# Patient Record
Sex: Male | Born: 1945 | Race: White | Hispanic: No | Smoking: Former smoker
Health system: Southern US, Community
[De-identification: ages and names within clinical notes are randomized; demographics above are authoritative.]

## PROBLEM LIST (undated history)

## (undated) DIAGNOSIS — E079 Disorder of thyroid, unspecified: Secondary | ICD-10-CM

## (undated) DIAGNOSIS — I10 Essential (primary) hypertension: Secondary | ICD-10-CM

## (undated) DIAGNOSIS — E785 Hyperlipidemia, unspecified: Secondary | ICD-10-CM

## (undated) DIAGNOSIS — E119 Type 2 diabetes mellitus without complications: Secondary | ICD-10-CM

## (undated) DIAGNOSIS — N200 Calculus of kidney: Secondary | ICD-10-CM

## (undated) HISTORY — PX: APPENDECTOMY: SHX54

## (undated) HISTORY — PX: HEMORRHOID SURGERY: SHX153

## (undated) HISTORY — DX: Hyperlipidemia, unspecified: E78.5

## (undated) HISTORY — DX: Disorder of thyroid, unspecified: E07.9

---

## 2004-09-18 ENCOUNTER — Encounter: Admission: RE | Admit: 2004-09-18 | Discharge: 2004-09-18 | Payer: Self-pay | Admitting: Family Medicine

## 2005-06-27 ENCOUNTER — Emergency Department (HOSPITAL_COMMUNITY): Admission: EM | Admit: 2005-06-27 | Discharge: 2005-06-27 | Payer: Self-pay | Admitting: Emergency Medicine

## 2005-11-05 ENCOUNTER — Ambulatory Visit (HOSPITAL_COMMUNITY): Admission: RE | Admit: 2005-11-05 | Discharge: 2005-11-05 | Payer: Self-pay | Admitting: Gastroenterology

## 2006-08-18 ENCOUNTER — Ambulatory Visit (HOSPITAL_COMMUNITY): Admission: RE | Admit: 2006-08-18 | Discharge: 2006-08-18 | Payer: Self-pay | Admitting: Gastroenterology

## 2006-08-18 ENCOUNTER — Encounter (INDEPENDENT_AMBULATORY_CARE_PROVIDER_SITE_OTHER): Payer: Self-pay | Admitting: Specialist

## 2006-09-16 ENCOUNTER — Ambulatory Visit (HOSPITAL_COMMUNITY): Admission: RE | Admit: 2006-09-16 | Discharge: 2006-09-16 | Payer: Self-pay | Admitting: Gastroenterology

## 2006-10-14 ENCOUNTER — Encounter: Admission: RE | Admit: 2006-10-14 | Discharge: 2006-10-14 | Payer: Self-pay | Admitting: General Surgery

## 2006-10-22 ENCOUNTER — Ambulatory Visit (HOSPITAL_COMMUNITY): Admission: RE | Admit: 2006-10-22 | Discharge: 2006-10-22 | Payer: Self-pay | Admitting: General Surgery

## 2006-10-26 ENCOUNTER — Ambulatory Visit (HOSPITAL_COMMUNITY): Admission: RE | Admit: 2006-10-26 | Discharge: 2006-10-26 | Payer: Self-pay | Admitting: General Surgery

## 2006-10-29 ENCOUNTER — Ambulatory Visit (HOSPITAL_COMMUNITY): Admission: RE | Admit: 2006-10-29 | Discharge: 2006-10-30 | Payer: Self-pay | Admitting: General Surgery

## 2006-10-29 ENCOUNTER — Encounter (INDEPENDENT_AMBULATORY_CARE_PROVIDER_SITE_OTHER): Payer: Self-pay | Admitting: Specialist

## 2007-08-31 ENCOUNTER — Ambulatory Visit (HOSPITAL_COMMUNITY): Admission: RE | Admit: 2007-08-31 | Discharge: 2007-08-31 | Payer: Self-pay | Admitting: Family Medicine

## 2010-09-22 ENCOUNTER — Encounter: Payer: Self-pay | Admitting: Gastroenterology

## 2011-01-17 NOTE — Op Note (Signed)
[name], [name]              ACCOUNT NO.:  [id number]   MEDICAL RECORD NO.:  [id number]          PATIENT TYPE:  AMB   LOCATION:  ENDO                         FACILITY:  [facility]   PHYSICIAN:  Petra Kuba, M.D.    DATE OF BIRTH:  [birth year]   DATE OF PROCEDURE:  08/19/2006  DATE OF DISCHARGE:                               OPERATIVE REPORT   PROCEDURE:  Colonoscopy.   INDICATIONS:  Change in bowel habits, due for colonic screening.  Consent was signed after risks, benefits, methods, options thoroughly  discussed in the office.   MEDICINES USED:  Fentanyl 100 mcg, Versed 8 mg.   PROCEDURE:  Rectal inspection is pertinent for external hemorrhoids,  significant.  Digital exam was tender.  No mass.  The video colonoscope  was inserted and with some difficulty due to a tortuous long, looping  colon with rolling him on his back and abdominal pressure, were able to  be advanced to the cecum.  No abnormalities were seen on insertion.  The  cecum was identified by the appendiceal orifice and ileocecal valve.  The scope was slowly withdrawn.  In the mid ascending were two small  pedunculated polyps which were both snared, electrocautery applied,  polyps were removed, suctioned through the scope and collected in a  trap.  The scope was slowly withdrawn.  He had a particularly tortuous  hepatic flexure.  As scope was slowly withdrawn in the mid and distal  transverse, two other small polyps were seen and both were snared,  electrocautery applied, suctioned through the scope, collected in the  trap, and put in a second container.  The scope was further withdrawn.  At approximately the sigmoid-descending junction was also another area  of significant tortuosity, a little bit scope trauma in this area but no  other abnormalities.  Scope was further withdrawn back to the rectum.  No additional findings were seen.  Once back in the rectum, anorectal  pull-through and retroflexion confirmed  significant hemorrhoids.  The  scope was straightened and readvanced a short way up the left side of  the colo, air was suctioned, scope removed.  The patient tolerated the  procedure fairly adequately.  There was no obvious immediate  complication.   ENDOSCOPIC DIAGNOSES:  1. Significant internal-external hemorrhoids.  2. Tortuous proximal sigmoid and hepatic flexure.  3. Four small polyps, two in the transverse, two in the ascending, all      snared and removed.  4. Otherwise within normal limits to the cecum.   PLAN:  Await pathology but probably recheck colon screening in 3 years.  Might even consider Diprivan.  Probably will need surgical consultation  for his hemorrhoids.  Happy to see back p.r.n., particularly if altered  bowel habits continue.          ______________________________  Petra Kuba, M.D.    MEM/MEDQ  D:  08/18/2006  T:  08/19/2006  Job:  [id number]   cc:   Olena Leatherwood [facility]

## 2011-01-17 NOTE — Op Note (Signed)
[name], [name]              ACCOUNT NO.:  [id number]   MEDICAL RECORD NO.:  [id number]          PATIENT TYPE:  AMB   LOCATION:  DAY                          FACILITY:  [name]   PHYSICIAN:  [name], M.D. DATE OF BIRTH:  1946/07/23   DATE OF PROCEDURE:  10/26/2006  DATE OF DISCHARGE:                               OPERATIVE REPORT   PREOPERATIVE DIAGNOSIS:  Hemorrhoids with prolapse, bleeding and anemia.   POSTOPERATIVE DIAGNOSIS:  Hemorrhoids with prolapse, bleeding and  anemia, plus massive prolapse.   OPERATION PERFORMED:  Examination under anesthesia, injection of  hemorrhoids to attempt reduction in size.   SURGEON:  [name], M.D.   ANESTHESIA:  General.   INDICATIONS FOR PROCEDURE:  [name] has been seen and worked up in  the office following a GI work-up by Dr. Vida Cochran.  His primary care  is [name].  Dr. Ewing Cochran had done endoscopy finding  no other source of rectal bleeding, except hemorrhoids.  The patient  when seen in the office was advised that hemorrhoidectomy would be in  order.  He has had hemorrhoids for 40 plus years, had always had some  bleeding, has been more in the recent past and he has used every  conservative management method possible.  He is otherwise well, a bit  overweight, but takes good care of himself.  He is self employed.  Because of abdominal symptoms, in addition to the colonoscopy, I did a  CT scan of the abdomen, which was negative.  Proctoscopy in the office  was negative, showing rather large hemorrhoids.  He is known to have  large external components as well as huge internal components with  prolapse so the choice of procedure we had left until today.   The patient was seen, interviewed, identified and the permit signed.   DESCRIPTION OF PROCEDURE:  The patient was taken to the operating room  and placed supine.  LMA anesthesia provided.  He was placed in lithotomy  and surprisingly, his  hemorrhoids were larger than ever with rather  massive prolapse edema of the external components.  So the area was  prepped and draped in the usual fashion.  I used 30 mL of Marcaine with  1 mL of Wydase and injected that round and about the anal orifice and  into the body of the hemorrhoids and massaged and massaged.  I put the  patient's head down.  I reduced the hemorrhoids manually and massaged  and massaged and I could not get the hemorrhoids to reduce in size  enough to hardly allow for the position of an operating anoscope and  then once the operating anoscope was in place, each of the three  hemorrhoids filled the entire lumen of the anoscope instrument. I think  it would be very unsafe today to try to do either a closed surgical  hemorrhoidectomy or PPH under the circumstances of the size and the  amount of swelling.  Therefore, I elected to do nothing today.  The  hemorrhoids were bandaged and a Foley catheter was inserted.   The  plan will be to sent the patient home, keep him at bed rest, keep  him on clear liquids only and revisit him day to day and decide when  would be an optimum time for hemorrhoidal procedure, either surgical  closed hemorrhoidectomy, PPH hemorrhoidectomy or perhaps, both.  So, the  patient will be discharged today in the care of his wife, Ms. [name], in [place] to be followed closely.      [name], M.D.  Electronically Signed     TED/MEDQ  D:  10/26/2006  T:  10/26/2006  Job:  [id number]   cc:   [name], M.D.  Fax: [phone]

## 2011-01-17 NOTE — Op Note (Signed)
[name], [name]              ACCOUNT NO.:  [id number]   MEDICAL RECORD NO.:  [id number]          PATIENT TYPE:  OIB   LOCATION:  0098                         FACILITY:  [facility]   PHYSICIAN:  Timothy E. Earlene Plater, M.D. DATE OF BIRTH:  [birth year]   DATE OF PROCEDURE:  10/29/2006  DATE OF DISCHARGE:                               OPERATIVE REPORT   PREOPERATIVE DIAGNOSIS:  Internal and external hemorrhoids with prolapse  and bleeding.   POSTOPERATIVE DIAGNOSIS:  Internal and external hemorrhoids with  prolapse and bleeding.   PROCEDURE:  Hemorrhoidectomy.   SURGEON:  Timothy E. Earlene Plater, M.D.   ANESTHESIA:  General.   [name] is 40, otherwise healthy, has had hemorrhoids for years, and  only recently have they produced prolapse and significant bleeding. When  seen and evaluated, he was moderately anemic. Surgery was attempted  earlier this week but because of massive size and edema, it was  cancelled.  In the meantime, he has been at bed rest with urinary  drainage and transfusions have been given.  He is now prepared for  surgery.  I have spent greater than 90 minutes in consultation with him  and his wife regarding this complicated situation, the specifics of the  surgery, and the expectations and complications.  He is seen, identified  and the permit signed.   He was taken to the operating room and placed supine and general LMA  anesthesia provided.  He was then lifted into the lithotomy position.  The perianal area was inspected, prepped and draped in the usual  fashion.  Hemorrhoids were remarkable in size.  There was no active  bleeding but the edema had been pretty much reduced.  There were large  internal hemorrhoids with prolapse left lateral, there were large  external components.  The area was injected around and about with 0.25%  Marcaine, epinephrine, and Wydase, and massaged in well.  Using the  large operating anoscope, the left lateral hemorrhoid was  approached  first.  A suture ligature of 2-0 chromic was placed at its apex and then  very careful sharp dissection lifted the hemorrhoid from its base. The  sphincter was avoided.  The apex of the hemorrhoid was clamped and the  hemorrhoid removed and then the same 2-0 chromic suture used in a  running fashion to the anoderm.  Then, careful but fairly extensive  undermining of the edges was accomplished removing large amounts of  superficial varicosities.  The wound was then closed completely with a  running 2-0 chromic.  The right posterior hemorrhoid was treated in the  same fashion.  I elected not to remove a residual right anterior  internal hemorrhoid at this time, it was indolent and I felt that there  had been adequate excision of tissue, particularly across the anoderm  and to avoid stricturing and other complications, the procedure was  stopped at this point.  The wound was checked extensively over 20  minutes for any bleeding.  There was none.  So the rectum was packed  with Surgicel wrapped over Gelfoam and a dry external dressing applied.  All  counts were correct.  He tolerated it well.  Blood loss 50 mL or  less.  He remained stable was removed to the recovery room in good  condition.   The patient will be kept at least overnight because of the risk of  bleeding and the magnitude of the surgery and his known anemia.  Following that, he will be discharged appropriately and followed as an  outpatient.      Timothy E. Earlene Plater, M.D.  Electronically Signed     TED/MEDQ  D:  10/29/2006  T:  10/29/2006  Job:  [id number]   cc:   Petra Kuba, M.D.  Fax: [phone]

## 2012-11-17 ENCOUNTER — Telehealth: Payer: Self-pay | Admitting: Family Medicine

## 2012-11-17 MED ORDER — AMLODIPINE BESYLATE 5 MG PO TABS: 5.0000 mg | ORAL_TABLET | Freq: Every day | ORAL | Status: DC

## 2012-11-17 NOTE — Telephone Encounter (Signed)
done

## 2012-11-22 ENCOUNTER — Telehealth: Payer: Self-pay | Admitting: Family Medicine

## 2012-11-22 MED ORDER — ATORVASTATIN CALCIUM 40 MG PO TABS: 40.0000 mg | ORAL_TABLET | Freq: Every day | ORAL | Status: DC

## 2012-11-22 NOTE — Telephone Encounter (Signed)
rx refilled.

## 2012-12-13 ENCOUNTER — Telehealth: Payer: Self-pay | Admitting: Family Medicine

## 2012-12-13 MED ORDER — METFORMIN HCL 500 MG PO TABS: 500.0000 mg | ORAL_TABLET | Freq: Two times a day (BID) | ORAL | Status: DC

## 2012-12-13 NOTE — Telephone Encounter (Signed)
Rx Refilled  

## 2012-12-24 ENCOUNTER — Other Ambulatory Visit: Payer: Self-pay | Admitting: Family Medicine

## 2012-12-26 ENCOUNTER — Other Ambulatory Visit: Payer: Self-pay | Admitting: Family Medicine

## 2012-12-29 ENCOUNTER — Encounter (HOSPITAL_COMMUNITY): Payer: Self-pay | Admitting: Emergency Medicine

## 2012-12-29 ENCOUNTER — Emergency Department (HOSPITAL_COMMUNITY)
Admission: EM | Admit: 2012-12-29 | Discharge: 2012-12-29 | Disposition: A | Discharge status: Home / Self Care | Payer: Medicare PPO | Attending: Emergency Medicine | Admitting: Emergency Medicine

## 2012-12-29 ENCOUNTER — Emergency Department (HOSPITAL_COMMUNITY): Payer: Medicare PPO

## 2012-12-29 DIAGNOSIS — Z87891 Personal history of nicotine dependence: Secondary | ICD-10-CM | POA: Insufficient documentation

## 2012-12-29 DIAGNOSIS — N200 Calculus of kidney: Secondary | ICD-10-CM | POA: Insufficient documentation

## 2012-12-29 DIAGNOSIS — Z9089 Acquired absence of other organs: Secondary | ICD-10-CM | POA: Insufficient documentation

## 2012-12-29 DIAGNOSIS — E119 Type 2 diabetes mellitus without complications: Secondary | ICD-10-CM | POA: Insufficient documentation

## 2012-12-29 DIAGNOSIS — Z79899 Other long term (current) drug therapy: Secondary | ICD-10-CM | POA: Insufficient documentation

## 2012-12-29 DIAGNOSIS — Z7982 Long term (current) use of aspirin: Secondary | ICD-10-CM | POA: Insufficient documentation

## 2012-12-29 DIAGNOSIS — I10 Essential (primary) hypertension: Secondary | ICD-10-CM | POA: Insufficient documentation

## 2012-12-29 HISTORY — DX: Type 2 diabetes mellitus without complications: E11.9

## 2012-12-29 HISTORY — DX: Essential (primary) hypertension: I10

## 2012-12-29 HISTORY — DX: Calculus of kidney: N20.0

## 2012-12-29 LAB — CBC WITH DIFFERENTIAL/PLATELET
Basophils Absolute: 0 10*3/uL (ref 0.0–0.1)
Basophils Relative: 0 % (ref 0–1)
Eosinophils Absolute: 0.2 10*3/uL (ref 0.0–0.7)
Eosinophils Relative: 2 % (ref 0–5)
HCT: 39.4 % (ref 39.0–52.0)
Hemoglobin: 14 g/dL (ref 13.0–17.0)
Lymphocytes Relative: 13 % (ref 12–46)
Lymphs Abs: 1.1 10*3/uL (ref 0.7–4.0)
MCH: 29.8 pg (ref 26.0–34.0)
MCHC: 35.5 g/dL (ref 30.0–36.0)
MCV: 83.8 fL (ref 78.0–100.0)
Monocytes Absolute: 0.5 10*3/uL (ref 0.1–1.0)
Monocytes Relative: 6 % (ref 3–12)
Neutro Abs: 6.2 10*3/uL (ref 1.7–7.7)
Neutrophils Relative %: 78 % — ABNORMAL HIGH (ref 43–77)
Platelets: 133 10*3/uL — ABNORMAL LOW (ref 150–400)
RBC: 4.7 MIL/uL (ref 4.22–5.81)
RDW: 12.6 % (ref 11.5–15.5)
WBC: 8 10*3/uL (ref 4.0–10.5)

## 2012-12-29 LAB — BASIC METABOLIC PANEL
BUN: 15 mg/dL (ref 6–23)
CO2: 26 mEq/L (ref 19–32)
Calcium: 9.7 mg/dL (ref 8.4–10.5)
Chloride: 103 mEq/L (ref 96–112)
Creatinine, Ser: 1.18 mg/dL (ref 0.50–1.35)
GFR calc Af Amer: 71 mL/min — ABNORMAL LOW (ref >90)
GFR calc non Af Amer: 62 mL/min — ABNORMAL LOW (ref >90)
Glucose, Bld: 196 mg/dL — ABNORMAL HIGH (ref 70–99)
Potassium: 4.4 mEq/L (ref 3.5–5.1)
Sodium: 139 mEq/L (ref 135–145)

## 2012-12-29 MED ORDER — KETOROLAC TROMETHAMINE 30 MG/ML IJ SOLN
30.0000 mg | Freq: Once | INTRAMUSCULAR | Status: AC
  Administered 2012-12-29: 30 mg via INTRAVENOUS
  Filled 2012-12-29: qty 1

## 2012-12-29 MED ORDER — TAMSULOSIN HCL 0.4 MG PO CAPS: 0.4000 mg | ORAL_CAPSULE | Freq: Every day | ORAL | Status: DC

## 2012-12-29 MED ORDER — PROMETHAZINE HCL 25 MG PO TABS: 25.0000 mg | ORAL_TABLET | Freq: Four times a day (QID) | ORAL | Status: DC | PRN

## 2012-12-29 MED ORDER — OXYCODONE-ACETAMINOPHEN 5-325 MG PO TABS: 1.0000 | ORAL_TABLET | Freq: Four times a day (QID) | ORAL | Status: DC | PRN

## 2012-12-29 MED ORDER — ONDANSETRON HCL 4 MG/2ML IJ SOLN
4.0000 mg | Freq: Once | INTRAMUSCULAR | Status: AC
  Administered 2012-12-29: 4 mg via INTRAVENOUS
  Filled 2012-12-29: qty 2

## 2012-12-29 MED ORDER — HYDROMORPHONE HCL PF 1 MG/ML IJ SOLN
1.0000 mg | Freq: Once | INTRAMUSCULAR | Status: AC
  Administered 2012-12-29: 1 mg via INTRAVENOUS
  Filled 2012-12-29: qty 1

## 2012-12-29 NOTE — ED Notes (Signed)
Pt with c/o left flank pain. Sudden onset at 0700 contracting stabbing sharp pain. Pt reports hx of kidney stones.

## 2012-12-29 NOTE — ED Notes (Signed)
Pt provided strainer, water at time of discharge.

## 2012-12-29 NOTE — ED Notes (Signed)
Pt returned from CT. Reporting return of pain. EDP made aware , awaiting CT results. Vitals stable, A x 4

## 2012-12-29 NOTE — ED Provider Notes (Signed)
History     CSN: [id number]  Arrival date & time 12/29/12  7829   First MD Initiated Contact with Patient 12/29/12 [phone]      Chief Complaint  Patient presents with  . Flank Pain    (Consider location/radiation/quality/duration/timing/severity/associated sxs/prior treatment) Patient is a 67 y.o. male presenting with flank pain. The history is provided by the patient (the pt complains of left flank pain).  Flank Pain This is a new problem. The current episode started 3 to 5 hours ago. The problem occurs constantly. The problem has not changed since onset.Pertinent negatives include no chest pain, no abdominal pain and no headaches. Nothing aggravates the symptoms. Nothing relieves the symptoms.    Past Medical History  Diagnosis Date  . Kidney stones   . Hypertension   . Diabetes mellitus without complication     Past Surgical History  Procedure Laterality Date  . Hemorrhoid surgery    . Appendectomy      No family history on file.  History  Substance Use Topics  . Smoking status: Former Games developer  . Smokeless tobacco: Not on file  . Alcohol Use: No      Review of Systems  Constitutional: Negative for appetite change and fatigue.  HENT: Negative for congestion, sinus pressure and ear discharge.   Eyes: Negative for discharge.  Respiratory: Negative for cough.   Cardiovascular: Negative for chest pain.  Gastrointestinal: Negative for abdominal pain and diarrhea.  Genitourinary: Positive for flank pain. Negative for frequency and hematuria.  Musculoskeletal: Negative for back pain.  Skin: Negative for rash.  Neurological: Negative for seizures and headaches.  Psychiatric/Behavioral: Negative for hallucinations.    Allergies  Review of patient's allergies indicates no known allergies.  Home Medications   Current Outpatient Rx  Name  Route  Sig  Dispense  Refill  . amLODipine (NORVASC) 5 MG tablet   Oral   Take 1 tablet (5 mg total) by mouth daily.   30  tablet   5   . aspirin EC 81 MG tablet   Oral   Take 81 mg by mouth daily.         Marland Kitchen atorvastatin (LIPITOR) 40 MG tablet      TAKE 1 TABLET BY MOUTH AT BEDTIME   30 tablet   5   . levothyroxine (SYNTHROID, LEVOTHROID) 125 MCG tablet      TAKE 1 TABLET BY MOUTH ONCE DAILY   30 tablet   3   . metFORMIN (GLUCOPHAGE) 500 MG tablet   Oral   Take 1 tablet (500 mg total) by mouth 2 (two) times daily with a meal.   60 tablet   5   . oxyCODONE-acetaminophen (PERCOCET/ROXICET) 5-325 MG per tablet   Oral   Take 1 tablet by mouth every 6 (six) hours as needed for pain.   20 tablet   0   . promethazine (PHENERGAN) 25 MG tablet   Oral   Take 1 tablet (25 mg total) by mouth every 6 (six) hours as needed for nausea.   15 tablet   0   . tamsulosin (FLOMAX) 0.4 MG CAPS   Oral   Take 1 capsule (0.4 mg total) by mouth daily.   5 capsule   0     BP 144/89  Pulse 65  Temp(Src) 97.7 F (36.5 C) (Oral)  Resp 24  Ht 6' (1.829 m)  Wt 210 lb (95.255 kg)  BMI 28.47 kg/m2  SpO2 95%  Physical Exam  Constitutional: He is  oriented to person, place, and time. He appears well-developed.  HENT:  Head: Normocephalic.  Eyes: Conjunctivae and EOM are normal. No scleral icterus.  Neck: Neck supple. No thyromegaly present.  Cardiovascular: Normal rate and regular rhythm.  Exam reveals no gallop and no friction rub.   No murmur heard. Pulmonary/Chest: No stridor. He has no wheezes. He has no rales. He exhibits no tenderness.  Abdominal: He exhibits no distension. There is no tenderness. There is no rebound.  Genitourinary:  Tender left flank  Musculoskeletal: Normal range of motion. He exhibits no edema.  Lymphadenopathy:    He has no cervical adenopathy.  Neurological: He is oriented to person, place, and time. Coordination normal.  Skin: No rash noted. No erythema.  Psychiatric: He has a normal mood and affect. His behavior is normal.    ED Course  Procedures (including  critical care time)  Labs Reviewed  CBC WITH DIFFERENTIAL - Abnormal; Notable for the following:    Platelets 133 (*)    Neutrophils Relative 78 (*)    All other components within normal limits  BASIC METABOLIC PANEL - Abnormal; Notable for the following:    Glucose, Bld 196 (*)    GFR calc non Af Amer 62 (*)    GFR calc Af Amer 71 (*)    All other components within normal limits   Ct Abdomen Pelvis Wo Contrast  12/29/2012  *RADIOLOGY REPORT*  Clinical Data: Left flank pain.  History of kidney stones.  CT ABDOMEN AND PELVIS WITHOUT CONTRAST  Technique:  Multidetector CT imaging of the abdomen and pelvis was performed following the standard protocol without intravenous contrast.  Comparison: 01/13/2007.  Findings: Left-sided hydronephrosis with distal left ureteral 3.5 mm calculus located 1 cm proximal to the left ureteral vesicle junction.  There is also a 1 mm calculus 2 cm proximal to this region.  Fatty infiltration of the liver. Evaluation of solid abdominal viscera is limited by lack of IV contrast.  Taking this limitation into account no worrisome focal hepatic, pancreatic, splenic, adrenal or renal lesion.  Left renal 1.5 cm cyst.  Calcification of the gallbladder.  Some these represent dependent gallstones.  Calcified gallbladder wall not excluded.  Small hiatal hernia.  No extraluminal bowel inflammatory process, free fluid or free air.  Prominent coronary artery calcifications.  Calcification of the aorta which is mildly ectatic without focal aneurysmal dilation. Calcification iliac vessels.  Degenerative changes lumbar spine.  No bony destructive lesion.  No hernia noted.  IMPRESSION: Left-sided hydronephrosis with distal left ureteral 3.5 mm calculus located 1 cm proximal to the left ureteral vesicle junction.  There is also a 1 mm calculus 2 cm proximal to this region.  Please see above for additional findings.   Original Report Authenticated By: Lacy Duverney, M.D.      1. Kidney stone        MDM          Benny Lennert, MD 12/29/12 [phone]

## 2013-01-04 ENCOUNTER — Ambulatory Visit
Admission: RE | Admit: 2013-01-04 | Discharge: 2013-01-04 | Disposition: A | Discharge status: Home / Self Care | Payer: 59 | Source: Ambulatory Visit | Attending: Family Medicine | Admitting: Family Medicine

## 2013-01-04 ENCOUNTER — Ambulatory Visit (INDEPENDENT_AMBULATORY_CARE_PROVIDER_SITE_OTHER): Payer: 59 | Admitting: Family Medicine

## 2013-01-04 ENCOUNTER — Encounter: Payer: Self-pay | Admitting: Family Medicine

## 2013-01-04 VITALS — BP 160/84 | HR 88 | Temp 98.2°F | Resp 20 | Wt 202.0 lb

## 2013-01-04 DIAGNOSIS — N200 Calculus of kidney: Secondary | ICD-10-CM

## 2013-01-04 DIAGNOSIS — I10 Essential (primary) hypertension: Secondary | ICD-10-CM

## 2013-01-04 DIAGNOSIS — E785 Hyperlipidemia, unspecified: Secondary | ICD-10-CM

## 2013-01-04 DIAGNOSIS — E119 Type 2 diabetes mellitus without complications: Secondary | ICD-10-CM

## 2013-01-04 LAB — HEPATIC FUNCTION PANEL
ALT: 15 U/L (ref 0–53)
AST: 17 U/L (ref 0–37)
Alkaline Phosphatase: 103 U/L (ref 39–117)
Bilirubin, Direct: 0.4 mg/dL — ABNORMAL HIGH (ref 0.0–0.3)
Indirect Bilirubin: 1.1 mg/dL — ABNORMAL HIGH (ref 0.0–0.9)

## 2013-01-04 LAB — URINALYSIS, ROUTINE W REFLEX MICROSCOPIC
Ketones, ur: NEGATIVE mg/dL
Nitrite: NEGATIVE
Protein, ur: 30 mg/dL — AB
Specific Gravity, Urine: 1.025 (ref 1.005–1.030)
Urobilinogen, UA: 4 mg/dL — ABNORMAL HIGH (ref 0.0–1.0)

## 2013-01-04 LAB — CBC WITH DIFFERENTIAL/PLATELET
Basophils Absolute: 0 10*3/uL (ref 0.0–0.1)
Eosinophils Relative: 3 % (ref 0–5)
Lymphocytes Relative: 10 % — ABNORMAL LOW (ref 12–46)
MCV: 85.9 fL (ref 78.0–100.0)
Platelets: 176 10*3/uL (ref 150–400)
RDW: 13.6 % (ref 11.5–15.5)
WBC: 6.9 10*3/uL (ref 4.0–10.5)

## 2013-01-04 LAB — HEMOGLOBIN A1C: Mean Plasma Glucose: 143 mg/dL — ABNORMAL HIGH (ref <117)

## 2013-01-04 LAB — LIPID PANEL
HDL: 32 mg/dL — ABNORMAL LOW (ref >39)
LDL Cholesterol: 82 mg/dL (ref 0–99)
Total CHOL/HDL Ratio: 4.9 Ratio

## 2013-01-04 LAB — BASIC METABOLIC PANEL
Chloride: 97 mEq/L (ref 96–112)
Potassium: 4.6 mEq/L (ref 3.5–5.3)
Sodium: 136 mEq/L (ref 135–145)

## 2013-01-04 LAB — URINALYSIS, MICROSCOPIC ONLY: Crystals: NONE SEEN

## 2013-01-04 MED ORDER — CIPROFLOXACIN HCL 500 MG PO TABS: 500.0000 mg | ORAL_TABLET | Freq: Two times a day (BID) | ORAL | Status: DC

## 2013-01-04 MED ORDER — OXYCODONE-ACETAMINOPHEN 5-325 MG PO TABS: 1.0000 | ORAL_TABLET | Freq: Four times a day (QID) | ORAL | Status: DC | PRN

## 2013-01-04 NOTE — Progress Notes (Signed)
Subjective:    Patient ID: [name], male    DOB: [birth year], 67 y.o.   MRN: [id number]  HPI  patient went to the emergency room one week ago left-sided abdominal pain. He was found to have a 3.5 mm kidney stone in 1 cm proximal to the bladder in the left ureter. He is also having some left hydronephrosis. He's been taking Percocet as prescribed he has also been on Flomax as prescribed. The pain is now settled and constant in his left lower quadrant. He's not had a bowel movement in over a week he is also reporting subjective fevers and chills. He denies nausea or vomiting. Her he has no appetite he has not been eating. His blood pressure is elevated today due to pain.  SS history hyperlipidemia for which he takes Lipitor 40 mg by mouth daily he denies myalgias right upper quadrant pain. He hasn't has diabetes mellitus type 2. He is currently on metformin. He denies polyuria, August or blurred vision. He also has hypertension. He current takes Norvasc 5 mg by mouth daily. He denies chest pain or shortness of breath. I wanted to add an ACE inhibitor in the past, however given the issues with his kidney stone and I do not feel is an appropriate time to even consider that. Past Medical History  Diagnosis Date  . Diabetes mellitus without complication   . Hypertension   . Kidney stones    Current Outpatient Prescriptions on File Prior to Visit  Medication Sig Dispense Refill  . amLODipine (NORVASC) 5 MG tablet Take 1 tablet (5 mg total) by mouth daily.  30 tablet  5  . aspirin EC 81 MG tablet Take 81 mg by mouth daily.      Marland Kitchen atorvastatin (LIPITOR) 40 MG tablet TAKE 1 TABLET BY MOUTH AT BEDTIME  30 tablet  5  . levothyroxine (SYNTHROID, LEVOTHROID) 125 MCG tablet TAKE 1 TABLET BY MOUTH ONCE DAILY  30 tablet  3  . metFORMIN (GLUCOPHAGE) 500 MG tablet Take 1 tablet (500 mg total) by mouth 2 (two) times daily with a meal.  60 tablet  5  . promethazine (PHENERGAN) 25 MG tablet Take 1 tablet (25  mg total) by mouth every 6 (six) hours as needed for nausea.  15 tablet  0  . tamsulosin (FLOMAX) 0.4 MG CAPS Take 1 capsule (0.4 mg total) by mouth daily.  5 capsule  0   No current facility-administered medications on file prior to visit.   No Known Allergies History   Social History  . Marital Status: Married    Spouse Name: N/A    Number of Children: N/A  . Years of Education: N/A   Occupational History  . Not on file.   Social History Main Topics  . Smoking status: Former Games developer  . Smokeless tobacco: Not on file  . Alcohol Use: No  . Drug Use: No  . Sexually Active: Not on file   Other Topics Concern  . Not on file   Social History Narrative  . No narrative on file     Review of Systems    review of systems is otherwise negative Objective:   Physical Exam  Constitutional: He appears distressed.  Eyes: Conjunctivae are normal. Pupils are equal, round, and reactive to light.  Neck: Normal range of motion. Neck supple. No thyromegaly present.  Cardiovascular: Normal rate and normal heart sounds.   No murmur heard. Pulmonary/Chest: Effort normal and breath sounds normal. No respiratory distress. He  has no wheezes. He has no rales.  Abdominal: Soft. He exhibits no distension. There is no tenderness. There is no rebound and no guarding.  Lymphadenopathy:    He has no cervical adenopathy.  Skin: He is not diaphoretic.   bowel sounds are sluggish.        Assessment & Plan:  1. Kidney stone Urinalysis still shows hematuria. There is no evidence of an infection in urinalysis. However given his fever and chills, given the fact the pain has not moved, given his significant discomfort, I feel is appropriate to repeat the CT scan to make sure there is not evidence of an obstruction that would require urology consultation.  - oxyCODONE-acetaminophen (PERCOCET/ROXICET) 5-325 MG per tablet; Take 1 tablet by mouth every 6 (six) hours as needed for pain.  Dispense: 60  tablet; Refill: 0 - CBC with Differential - Urinalysis, Routine w reflex microscopic - CT Abdomen Pelvis Wo Contrast; Future  2. Type II or unspecified type diabetes mellitus without mention of complication, not stated as uncontrolled Goal A1c is less than 6.5. Check hemoglobin A1c. - Basic Metabolic Panel - Hemoglobin A1c  3. HTN (hypertension) Blood pressure is elevated today due to pain. Pending resolution of probable one, would recommend starting either an ACE inhibitor or an ARB  4. HLD (hyperlipidemia) The LDL is less than 100. Check fasting lipid panel. - Hepatic Function Panel - Lipid Panel

## 2013-01-04 NOTE — Addendum Note (Signed)
Addended by: Lynnea Ferrier on: 01/04/2013 04:35 PM   Modules accepted: Orders

## 2013-01-11 ENCOUNTER — Other Ambulatory Visit: Payer: Self-pay | Admitting: Family Medicine

## 2013-01-11 MED ORDER — ATORVASTATIN CALCIUM 40 MG PO TABS: 40.0000 mg | ORAL_TABLET | Freq: Every day | ORAL | Status: DC

## 2013-01-11 NOTE — Telephone Encounter (Signed)
Rx Refilled  

## 2013-01-13 ENCOUNTER — Other Ambulatory Visit: Payer: 59

## 2013-01-14 ENCOUNTER — Other Ambulatory Visit (INDEPENDENT_AMBULATORY_CARE_PROVIDER_SITE_OTHER): Payer: 59

## 2013-01-14 DIAGNOSIS — I10 Essential (primary) hypertension: Secondary | ICD-10-CM

## 2013-01-14 DIAGNOSIS — E785 Hyperlipidemia, unspecified: Secondary | ICD-10-CM

## 2013-01-14 DIAGNOSIS — Z0000 Encounter for general adult medical examination without abnormal findings: Secondary | ICD-10-CM

## 2013-01-14 LAB — BASIC METABOLIC PANEL
CO2: 28 mEq/L (ref 19–32)
Chloride: 105 mEq/L (ref 96–112)
Potassium: 5.3 mEq/L (ref 3.5–5.3)
Sodium: 139 mEq/L (ref 135–145)

## 2013-01-18 ENCOUNTER — Encounter: Payer: Self-pay | Admitting: Family Medicine

## 2013-01-18 NOTE — Progress Notes (Deleted)
Patient ID: [name], male   DOB: 01-07-1946, 67 y.o.   MRN: [id number]

## 2013-01-18 NOTE — Progress Notes (Signed)
Error- please disregard

## 2013-04-25 ENCOUNTER — Other Ambulatory Visit: Payer: Self-pay | Admitting: Family Medicine

## 2013-05-17 ENCOUNTER — Other Ambulatory Visit: Payer: Self-pay | Admitting: Family Medicine

## 2013-05-17 NOTE — Telephone Encounter (Signed)
Meds refilled.

## 2013-06-20 ENCOUNTER — Other Ambulatory Visit: Payer: Self-pay | Admitting: Family Medicine

## 2013-07-01 ENCOUNTER — Other Ambulatory Visit: Payer: Self-pay | Admitting: Family Medicine

## 2013-07-01 MED ORDER — TADALAFIL 20 MG PO TABS: 20.0000 mg | ORAL_TABLET | Freq: Every day | ORAL | Status: DC | PRN

## 2013-07-26 ENCOUNTER — Encounter: Payer: Self-pay | Admitting: Family Medicine

## 2013-07-26 ENCOUNTER — Ambulatory Visit (INDEPENDENT_AMBULATORY_CARE_PROVIDER_SITE_OTHER): Payer: 59 | Admitting: Family Medicine

## 2013-07-26 VITALS — BP 124/78 | HR 68 | Temp 97.1°F | Resp 18 | Ht 72.0 in | Wt 214.0 lb

## 2013-07-26 DIAGNOSIS — Z23 Encounter for immunization: Secondary | ICD-10-CM

## 2013-07-26 DIAGNOSIS — E039 Hypothyroidism, unspecified: Secondary | ICD-10-CM

## 2013-07-26 DIAGNOSIS — E119 Type 2 diabetes mellitus without complications: Secondary | ICD-10-CM

## 2013-07-26 DIAGNOSIS — M545 Low back pain: Secondary | ICD-10-CM

## 2013-07-26 DIAGNOSIS — I10 Essential (primary) hypertension: Secondary | ICD-10-CM

## 2013-07-26 DIAGNOSIS — E785 Hyperlipidemia, unspecified: Secondary | ICD-10-CM

## 2013-07-26 LAB — COMPLETE METABOLIC PANEL WITH GFR
Albumin: 4.8 g/dL (ref 3.5–5.2)
BUN: 16 mg/dL (ref 6–23)
CO2: 25 mEq/L (ref 19–32)
Calcium: 9.8 mg/dL (ref 8.4–10.5)
Chloride: 103 mEq/L (ref 96–112)
Creat: 1.1 mg/dL (ref 0.50–1.35)
GFR, Est African American: 79 mL/min

## 2013-07-26 LAB — MICROALBUMIN, URINE: Microalb, Ur: <0.5 mg/dL (ref 0.00–1.89)

## 2013-07-26 LAB — HEMOGLOBIN A1C: Mean Plasma Glucose: 137 mg/dL — ABNORMAL HIGH (ref <117)

## 2013-07-26 LAB — TSH: TSH: 1.665 u[IU]/mL (ref 0.350–4.500)

## 2013-07-26 LAB — LIPID PANEL
Cholesterol: 167 mg/dL (ref 0–200)
LDL Cholesterol: 74 mg/dL (ref 0–99)
Triglycerides: 285 mg/dL — ABNORMAL HIGH (ref <150)

## 2013-07-26 MED ORDER — CYCLOBENZAPRINE HCL 10 MG PO TABS: 10.0000 mg | ORAL_TABLET | Freq: Three times a day (TID) | ORAL | Status: DC | PRN

## 2013-07-26 NOTE — Addendum Note (Signed)
Addended by: Legrand Rams B on: 07/26/2013 08:33 AM   Modules accepted: Orders

## 2013-07-26 NOTE — Progress Notes (Signed)
Subjective:    Patient ID: [name], male    DOB: 05-18-1946, 67 y.o.   MRN: [id number]  HPI Patient is here today for followup of his hypertension, diabetes mellitus type 2, hyperlipidemia, hypothyroidism. He is also complaining of 5 weeks of right lower back pain. It does not radiate. It is worse with twisting movement. If appetite loss but improves as he limbers about throughout the day. He denies any sciatica or radiculopathy. He denies any symptoms of cauda equina syndrome. He denies any hematuria or fevers. The remainder of his review of systems for his checkup is negative. He denies any chest pain or shortness of breath. He denies any polyuria polydipsia or blurred vision. He denies any hypoglycemia. He denies any myalgia or right upper quadrant pain. He is due for his Prevnar 11. He declines a flu shot. Past Medical History  Diagnosis Date  . Diabetes mellitus without complication   . Hypertension   . Kidney stones   . Hyperlipidemia   . Thyroid disease     hypothyroidism   Past Surgical History  Procedure Laterality Date  . Hemorrhoid surgery    . Appendectomy     Current Outpatient Prescriptions on File Prior to Visit  Medication Sig Dispense Refill  . amLODipine (NORVASC) 5 MG tablet TAKE 1 TABLET BY MOUTH ONCE A DAY  30 tablet  5  . aspirin EC 81 MG tablet Take 81 mg by mouth daily.      Marland Kitchen atorvastatin (LIPITOR) 40 MG tablet Take 1 tablet (40 mg total) by mouth daily.  30 tablet  5  . benazepril (LOTENSIN) 40 MG tablet TAKE 1 TABLET BY MOUTH EVERY DAY  30 tablet  6  . levothyroxine (SYNTHROID, LEVOTHROID) 125 MCG tablet TAKE 1 TABLET BY MOUTH ONCE A DAY  30 tablet  3  . metFORMIN (GLUCOPHAGE) 500 MG tablet Take 1 tablet (500 mg total) by mouth 2 (two) times daily with a meal.  60 tablet  5  . oxyCODONE-acetaminophen (PERCOCET/ROXICET) 5-325 MG per tablet Take 1 tablet by mouth every 6 (six) hours as needed for pain.  60 tablet  0  . tadalafil (CIALIS) 20 MG tablet  Take 1 tablet (20 mg total) by mouth daily as needed for erectile dysfunction.  10 tablet  11   No current facility-administered medications on file prior to visit.   No Known Allergies History   Social History  . Marital Status: Married    Spouse Name: N/A    Number of Children: N/A  . Years of Education: N/A   Occupational History  . Not on file.   Social History Main Topics  . Smoking status: Former Games developer  . Smokeless tobacco: Former Neurosurgeon  . Alcohol Use: No  . Drug Use: No  . Sexual Activity: Not on file     Comment: separated from wife,    Other Topics Concern  . Not on file   Social History Narrative  . No narrative on file      Review of Systems  All other systems reviewed and are negative.       Objective:   Physical Exam  Vitals reviewed. Constitutional: He is oriented to person, place, and time.  Neck: Neck supple. No JVD present. No thyromegaly present.  Cardiovascular: Normal rate, regular rhythm, normal heart sounds and intact distal pulses.  Exam reveals no gallop and no friction rub.   No murmur heard. Pulmonary/Chest: Effort normal and breath sounds normal. No respiratory distress. He  has no wheezes. He has no rales. He exhibits no tenderness.  Abdominal: Soft. Bowel sounds are normal. He exhibits no distension and no mass. There is no tenderness. There is no rebound and no guarding.  Musculoskeletal: Normal range of motion. He exhibits tenderness. He exhibits no edema.       Lumbar back: He exhibits tenderness, pain and spasm. He exhibits normal range of motion and no bony tenderness.  Lymphadenopathy:    He has no cervical adenopathy.  Neurological: He is alert and oriented to person, place, and time. He has normal reflexes. He displays normal reflexes. No cranial nerve deficit. He exhibits normal muscle tone. Coordination normal.          Assessment & Plan:  1. Type II or unspecified type diabetes mellitus without mention of complication,  not stated as uncontrolled I will check hemoglobin A1c. Goal A1c is less than 6.5. The patient is taking an aspirin every day. He gets his eyes checked regularly. I also check a urine microalbumin.  Patient was given Prevnar 13 today the - COMPLETE METABOLIC PANEL WITH GFR - Hemoglobin A1c - Microalbumin, urine  2. HLD (hyperlipidemia) Check fasting lipid panel. Goal LDL is less than 100. - COMPLETE METABOLIC PANEL WITH GFR - Lipid panel  3. HTN (hypertension) Pressures well controlled, continue current medications at the present dosages. - COMPLETE METABOLIC PANEL WITH GFR  4. Unspecified hypothyroidism - TSH  5. Low back pain I suspect a pulled muscle. I recommended Flexeril 10 mg by mouth each bedtime for one to 2 weeks. If it is improving we will give her tincture of time. If it is not improving we'll proceed with a x-ray of the lumbar spine - cyclobenzaprine (FLEXERIL) 10 MG tablet; Take 1 tablet (10 mg total) by mouth 3 (three) times daily as needed for muscle spasms.  Dispense: 30 tablet; Refill: 0

## 2013-07-29 ENCOUNTER — Encounter: Payer: Self-pay | Admitting: Family Medicine

## 2013-08-04 ENCOUNTER — Encounter: Payer: Self-pay | Admitting: Family Medicine

## 2013-08-04 NOTE — Telephone Encounter (Signed)
Pt is calling today to see about his lab results Call back number is [phone]

## 2013-08-05 ENCOUNTER — Other Ambulatory Visit: Payer: Self-pay | Admitting: Family Medicine

## 2013-08-05 NOTE — Telephone Encounter (Signed)
Medication refilled per protocol. 

## 2013-08-12 NOTE — Telephone Encounter (Signed)
This encounter was created in error - please disregard.

## 2013-08-29 ENCOUNTER — Other Ambulatory Visit: Payer: Self-pay | Admitting: Family Medicine

## 2013-09-02 ENCOUNTER — Other Ambulatory Visit: Payer: Self-pay | Admitting: Family Medicine

## 2013-09-05 ENCOUNTER — Other Ambulatory Visit: Payer: Self-pay | Admitting: Family Medicine

## 2013-09-13 ENCOUNTER — Encounter: Payer: Self-pay | Admitting: Physician Assistant

## 2013-09-13 ENCOUNTER — Ambulatory Visit (INDEPENDENT_AMBULATORY_CARE_PROVIDER_SITE_OTHER): Payer: 59 | Admitting: Physician Assistant

## 2013-09-13 VITALS — BP 126/76 | HR 80 | Temp 98.3°F | Resp 20 | Ht 70.75 in | Wt 210.0 lb

## 2013-09-13 DIAGNOSIS — B9789 Other viral agents as the cause of diseases classified elsewhere: Secondary | ICD-10-CM

## 2013-09-13 DIAGNOSIS — R509 Fever, unspecified: Secondary | ICD-10-CM

## 2013-09-13 DIAGNOSIS — J988 Other specified respiratory disorders: Secondary | ICD-10-CM

## 2013-09-13 LAB — INFLUENZA A AND B
Inflenza A Ag: NEGATIVE
Influenza B Ag: NEGATIVE

## 2013-09-13 NOTE — Progress Notes (Signed)
Patient ID: ENZIO BUCHLER MRN: [id number], DOB: Jun 12, 1946, 68 y.o. Date of Encounter: 09/13/2013, 4:05 PM    Chief Complaint:  Chief Complaint  Patient presents with  . c/o flu since weekend    fever,aches, chills     HPI: 68 y.o. year old white male reports that he started feeling sick on Thursday 09/08/12. Says for the  first 3 or 4 days he felt exhausted and had  chills. Says he felt so bad he didn't even feel like leaving the house to come here to be evaluated. Says that he is finally feeling better as far as his energy level. No longer having the chills. Says he has had some cough, especially at night affecting his sleep. Also has had some runny nose. Has had no sore throat. He thought he better come in and get checked since he was feeling sick. Says he "really doesn't even know what uses to do for this kind of thing."     Home Meds: See attached medication section for any medications that were entered at today's visit. The computer does not put those onto this list.The following list is a list of meds entered prior to today's visit.   Current Outpatient Prescriptions on File Prior to Visit  Medication Sig Dispense Refill  . amLODipine (NORVASC) 5 MG tablet TAKE 1 TABLET BY MOUTH ONCE A DAY  30 tablet  5  . aspirin EC 81 MG tablet Take 81 mg by mouth daily.      Marland Kitchen atorvastatin (LIPITOR) 40 MG tablet TAKE 1 TABLET BY MOUTH ONCE A DAY  30 tablet  5  . benazepril (LOTENSIN) 40 MG tablet TAKE 1 TABLET BY MOUTH EVERY DAY  30 tablet  6  . cyclobenzaprine (FLEXERIL) 10 MG tablet Take 1 tablet (10 mg total) by mouth 3 (three) times daily as needed for muscle spasms.  30 tablet  0  . levothyroxine (SYNTHROID, LEVOTHROID) 125 MCG tablet TAKE 1 TABLET BY MOUTH ONCE A DAY  30 tablet  3  . metFORMIN (GLUCOPHAGE) 500 MG tablet TAKE 1 TABLET BY MOUTH TWICE A DAY WITH A MEAL  60 tablet  5  . oxyCODONE-acetaminophen (PERCOCET/ROXICET) 5-325 MG per tablet Take 1 tablet by mouth every 6 (six)  hours as needed for pain.  60 tablet  0  . tadalafil (CIALIS) 20 MG tablet Take 1 tablet (20 mg total) by mouth daily as needed for erectile dysfunction.  10 tablet  11   No current facility-administered medications on file prior to visit.    Allergies: No Known Allergies    Review of Systems: See HPI for pertinent ROS. All other ROS negative.    Physical Exam: Blood pressure 126/76, pulse 80, temperature 98.3 F (36.8 C), temperature source Oral, resp. rate 20, height 5' 10.75" (1.797 m), weight 210 lb (95.255 kg)., Body mass index is 29.5 kg/(m^2). General: WNWD WM. Appears in no acute distress. HEENT: Normocephalic, atraumatic, eyes without discharge, sclera non-icteric, nares are without discharge. Bilateral auditory canals clear, TM's are without perforation, pearly grey and translucent with reflective cone of light bilaterally. Oral cavity moist, posterior pharynx without exudate, erythema, peritonsillar abscess. No tenderness with percussion of sinuses.  Neck: Supple. No thyromegaly. No lymphadenopathy. Lungs: Clear bilaterally to auscultation without wheezes, rales, or rhonchi. Breathing is unlabored. Heart: Regular rhythm. No murmurs, rubs, or gallops. Msk:  Strength and tone normal for age. Extremities/Skin: Warm and dry. No clubbing or cyanosis. No edema. No rashes or suspicious lesions. Neuro:  Alert and oriented X 3. Moves all extremities spontaneously. Gait is normal. CNII-XII grossly in tact. Psych:  Responds to questions appropriately with a normal affect.     ASSESSMENT AND PLAN:  68 y.o. year old male with  1. Viral respiratory infection  2. Chills with fever - Influenza a and b  Explained to him viral vs bacterial respiratory infections. I discussed with him that at this time I feel that he probably has a viral infection. If so, this should continue to improve and symptoms should gradually resolve over the next several days. I offered a prescription cough  suppressant to use at night. He says that he can discontinue using over-the-counter Delsym for this. I told him to call me as his symptoms worsen or if symptoms persist greater than 7-10 days.  [address]igned, Christiona Siddique Beth HullDixon, [place], [facility] 09/13/2013 4:05 PM

## 2013-10-03 ENCOUNTER — Other Ambulatory Visit: Payer: Self-pay | Admitting: Family Medicine

## 2013-10-05 ENCOUNTER — Other Ambulatory Visit: Payer: Self-pay | Admitting: Family Medicine

## 2013-11-07 ENCOUNTER — Other Ambulatory Visit: Payer: Self-pay | Admitting: Family Medicine

## 2013-11-07 DIAGNOSIS — Z1211 Encounter for screening for malignant neoplasm of colon: Secondary | ICD-10-CM

## 2013-11-08 ENCOUNTER — Ambulatory Visit (INDEPENDENT_AMBULATORY_CARE_PROVIDER_SITE_OTHER): Payer: 59 | Admitting: Family Medicine

## 2013-11-08 ENCOUNTER — Encounter: Payer: Self-pay | Admitting: Family Medicine

## 2013-11-08 VITALS — BP 130/78 | HR 78 | Temp 97.0°F | Resp 18 | Ht 72.0 in | Wt 211.0 lb

## 2013-11-08 DIAGNOSIS — N529 Male erectile dysfunction, unspecified: Secondary | ICD-10-CM

## 2013-11-08 MED ORDER — SILDENAFIL CITRATE 100 MG PO TABS: 50.0000 mg | ORAL_TABLET | Freq: Every day | ORAL | Status: DC | PRN

## 2013-11-08 NOTE — Progress Notes (Signed)
   Subjective:    Patient ID: [name], male    DOB: 26-Dec-1945, 68 y.o.   MRN: [id number]  HPI  Patient presents today complaining of erectile dysfunction. He has a difficult time obtaining an erection and keeping an erection. He has no problems objective patient. His presenting diagnosis of erectile dysfunction in the past. This been complicated with treatment for hypertension as well as diabetes. He denies any fatigue. He denies labeled B. note. He denies any depression. He is interested in treatment for erectile dysfunction. Past Medical History  Diagnosis Date  . Diabetes mellitus without complication   . Hypertension   . Kidney stones   . Hyperlipidemia   . Thyroid disease     hypothyroidism   Current Outpatient Prescriptions on File Prior to Visit  Medication Sig Dispense Refill  . amLODipine (NORVASC) 5 MG tablet TAKE 1 TABLET BY MOUTH ONCE A DAY  30 tablet  5  . aspirin EC 81 MG tablet Take 81 mg by mouth daily.      Marland Kitchen. atorvastatin (LIPITOR) 40 MG tablet TAKE 1 TABLET BY MOUTH ONCE A DAY  30 tablet  5  . benazepril (LOTENSIN) 40 MG tablet TAKE 1 TABLET BY MOUTH EVERY DAY  30 tablet  6  . levothyroxine (SYNTHROID, LEVOTHROID) 125 MCG tablet TAKE 1 TABLET BY MOUTH ONCE A DAY  30 tablet  3  . metFORMIN (GLUCOPHAGE) 500 MG tablet TAKE 1 TABLET BY MOUTH TWICE A DAY WITH A MEAL  60 tablet  5   No current facility-administered medications on file prior to visit.   No Known Allergies History   Social History  . Marital Status: Married    Spouse Name: N/A    Number of Children: N/A  . Years of Education: N/A   Occupational History  . Not on file.   Social History Main Topics  . Smoking status: Former Games developermoker  . Smokeless tobacco: Former NeurosurgeonUser  . Alcohol Use: No  . Drug Use: No  . Sexual Activity: Not on file     Comment: separated from wife,    Other Topics Concern  . Not on file   Social History Narrative  . No narrative on file     Review of Systems  All  other systems reviewed and are negative.       Objective:   Physical Exam  Vitals reviewed. Cardiovascular: Normal rate, regular rhythm and normal heart sounds.   Pulmonary/Chest: Effort normal and breath sounds normal. No respiratory distress. He has no wheezes. He has no rales.  Abdominal: Soft. Bowel sounds are normal. He exhibits no distension. There is no tenderness. There is no rebound.          Assessment & Plan:  1. Erectile dysfunction Here the patient back to 100 mg tablets. He can take one half to one whole tablet every day as needed for erectile dysfunction. I gave him 5 tablets with 11 refills. - sildenafil (VIAGRA) 100 MG tablet; Take 0.5-1 tablets (50-100 mg total) by mouth daily as needed for erectile dysfunction.  Dispense: 5 tablet; Refill: 11

## 2013-12-09 ENCOUNTER — Encounter: Payer: Self-pay | Admitting: Family Medicine

## 2014-01-02 ENCOUNTER — Other Ambulatory Visit: Payer: Self-pay | Admitting: Family Medicine

## 2014-01-02 NOTE — Telephone Encounter (Signed)
Refill appropriate and filled per protocol. 

## 2014-01-30 ENCOUNTER — Encounter: Payer: Self-pay | Admitting: Family Medicine

## 2014-01-30 ENCOUNTER — Other Ambulatory Visit: Payer: Self-pay | Admitting: Family Medicine

## 2014-01-30 NOTE — Telephone Encounter (Signed)
Medication refill for one time only.  Patient needs to be seen.  Letter sent for patient to call and schedule 

## 2014-02-14 ENCOUNTER — Telehealth: Payer: Self-pay | Admitting: Family Medicine

## 2014-02-14 NOTE — Telephone Encounter (Signed)
Message copied by Ricard DillonWILLIS, SANDY B on Tue Feb 14, 2014  4:45 PM ------      Message from: Malvin JohnsBULLINS, SUSAN S      Created: Tue Feb 14, 2014  9:52 AM       [phone]      Patient would like to talk with you about some arthritis or gout that he is having in his feet  ------

## 2014-02-14 NOTE — Telephone Encounter (Signed)
LMTRC

## 2014-02-15 NOTE — Telephone Encounter (Signed)
Pt called and he is having a gout flare and it is very painful in his foot. He was wanting to know if we could call something in for it or if he needed to be seen first??? He is not on any medication for Gout.

## 2014-02-15 NOTE — Telephone Encounter (Signed)
Pt aware via vm - will work in tom if wants to be seen.

## 2014-02-15 NOTE — Telephone Encounter (Signed)
Make OV for the gout

## 2014-02-16 ENCOUNTER — Ambulatory Visit: Payer: 59 | Admitting: Physician Assistant

## 2014-02-16 ENCOUNTER — Encounter: Payer: Self-pay | Admitting: Physician Assistant

## 2014-02-16 ENCOUNTER — Ambulatory Visit (INDEPENDENT_AMBULATORY_CARE_PROVIDER_SITE_OTHER): Payer: 59 | Admitting: Physician Assistant

## 2014-02-16 VITALS — BP 120/70 | HR 62 | Temp 97.3°F | Resp 18 | Ht 72.0 in | Wt 215.0 lb

## 2014-02-16 DIAGNOSIS — M109 Gout, unspecified: Secondary | ICD-10-CM | POA: Insufficient documentation

## 2014-02-16 LAB — COMPLETE METABOLIC PANEL WITH GFR
ALT: 14 U/L (ref 0–53)
AST: 14 U/L (ref 0–37)
Albumin: 4.3 g/dL (ref 3.5–5.2)
Alkaline Phosphatase: 99 U/L (ref 39–117)
BUN: 17 mg/dL (ref 6–23)
CALCIUM: 9 mg/dL (ref 8.4–10.5)
CHLORIDE: 102 meq/L (ref 96–112)
CO2: 25 meq/L (ref 19–32)
Creat: 1.19 mg/dL (ref 0.50–1.35)
GFR, EST AFRICAN AMERICAN: 71 mL/min
GFR, Est Non African American: 62 mL/min
Glucose, Bld: 153 mg/dL — ABNORMAL HIGH (ref 70–99)
Potassium: 4.4 mEq/L (ref 3.5–5.3)
SODIUM: 137 meq/L (ref 135–145)
TOTAL PROTEIN: 7 g/dL (ref 6.0–8.3)
Total Bilirubin: 1.2 mg/dL (ref 0.2–1.2)

## 2014-02-16 LAB — CBC WITH DIFFERENTIAL/PLATELET
Basophils Absolute: 0 10*3/uL (ref 0.0–0.1)
Basophils Relative: 0 % (ref 0–1)
EOS PCT: 4 % (ref 0–5)
Eosinophils Absolute: 0.2 10*3/uL (ref 0.0–0.7)
HCT: 39.4 % (ref 39.0–52.0)
HEMOGLOBIN: 13.5 g/dL (ref 13.0–17.0)
LYMPHS ABS: 1.3 10*3/uL (ref 0.7–4.0)
LYMPHS PCT: 24 % (ref 12–46)
MCH: 29.8 pg (ref 26.0–34.0)
MCHC: 34.3 g/dL (ref 30.0–36.0)
MCV: 87 fL (ref 78.0–100.0)
MONOS PCT: 8 % (ref 3–12)
Monocytes Absolute: 0.4 10*3/uL (ref 0.1–1.0)
NEUTROS PCT: 64 % (ref 43–77)
Neutro Abs: 3.5 10*3/uL (ref 1.7–7.7)
PLATELETS: 149 10*3/uL — AB (ref 150–400)
RBC: 4.53 MIL/uL (ref 4.22–5.81)
RDW: 13.4 % (ref 11.5–15.5)
WBC: 5.4 10*3/uL (ref 4.0–10.5)

## 2014-02-16 LAB — URIC ACID: Uric Acid, Serum: 7.9 mg/dL — ABNORMAL HIGH (ref 4.0–7.8)

## 2014-02-16 MED ORDER — COLCHICINE 0.6 MG PO TABS: ORAL_TABLET | ORAL | Status: DC

## 2014-02-16 NOTE — Progress Notes (Signed)
Patient ID: [name] MRN: [id number], DOB: 1946-05-05, 68 y.o. Date of Encounter: 02/16/2014, 11:14 AM    Chief Complaint:  Chief Complaint  Patient presents with  . Gout     HPI: 68 y.o. year old white male reports that in past years he has had flares of what he is pretty sure has been gout but says that he has been able to use Advil and they would resolve. Says this has happened several times -- years ago. However he would have a mild flare take his Advil it would resolve and then he would have no flare for one or 2 years. Therefore has never had any medical evaluation of this in the past.  However this flare has been going on for several weeks now and it is not resolving. Says it started a few weeks ago and he's been using some Advil and it seemed to be easing but then it came back. Says it is keeping him awake at night some. Is very uncomfortable to get on any shoes and he is not able to play golf!! He decided he needed to come in.  Says he had no trauma or injury to the area. Has had no fevers or chills.     Home Meds:   Outpatient Prescriptions Prior to Visit  Medication Sig Dispense Refill  . amLODipine (NORVASC) 5 MG tablet TAKE 1 TABLET BY MOUTH ONCE A DAY  30 tablet  0  . aspirin EC 81 MG tablet Take 81 mg by mouth daily.      Marland Kitchen. atorvastatin (LIPITOR) 40 MG tablet TAKE 1 TABLET BY MOUTH ONCE A DAY  30 tablet  5  . benazepril (LOTENSIN) 40 MG tablet TAKE 1 TABLET BY MOUTH EVERY DAY  30 tablet  6  . levothyroxine (SYNTHROID, LEVOTHROID) 125 MCG tablet TAKE 1 TABLET BY MOUTH ONCE A DAY  30 tablet  3  . metFORMIN (GLUCOPHAGE) 500 MG tablet TAKE 1 TABLET BY MOUTH TWICE A DAY WITH A MEAL  60 tablet  5  . sildenafil (VIAGRA) 100 MG tablet Take 0.5-1 tablets (50-100 mg total) by mouth daily as needed for erectile dysfunction.  5 tablet  11   No facility-administered medications prior to visit.    Allergies: No Known Allergies    Review of Systems: See HPI for  pertinent ROS. All other ROS negative.    Physical Exam: Blood pressure 120/70, pulse 62, temperature 97.3 F (36.3 C), temperature source Oral, resp. rate 18, height 6' (1.829 m), weight 215 lb (97.523 kg)., Body mass index is 29.15 kg/(m^2). General: WNWD WM.  Appears in no acute distress. Lungs: Clear bilaterally to auscultation without wheezes, rales, or rhonchi. Breathing is unlabored. Heart: Regular rhythm. No murmurs, rubs, or gallops. Msk:  Strength and tone normal for age. Extremities/Skin: Warm and dry. Right Foot: At MTP joint of 1st Toe: There is minimal swelling. There is some warmth. There is diffuse erythema. The area is very tender even with light palpation.  Neuro: Alert and oriented X 3. Moves all extremities spontaneously. Gait is normal. CNII-XII grossly in tact. Psych:  Responds to questions appropriately with a normal affect.     ASSESSMENT AND PLAN:  68 y.o. year old male with  1. Gout  Will check labs. Check CBC to ensure there is no indication of bacterial infection/cellulitis causing the current signs and symptoms. He is afebrile with temperature 97.3 and reports no fevers or chills. There is no puncture wound to the  skin. Will start Colcrys to treat acute gout flare. I also explained to him that there are medications available that can be prescribed that help excrete uric acid to prevent future gout flares. He is not interested in this medication at this time as he says that he usually has at least one to 2 years between any flares and that all prior flares have been very mild.  Well, he is already aware of foods to avoid that are high in uric acid. He is to follow up if this flare does not resolve over the next few days or if he does have recurrence in the future.   - CBC with Differential - COMPLETE METABOLIC PANEL WITH GFR - Uric acid - colchicine 0.6 MG tablet; At onset, Take 2 tablets, then 1 hour later, take 1 tablet. Then take 1 twice a day  Dispense:  60 tablet; Refill: 1   Signed, [address]Mary Beth Anderson CreekDixon, [place]PA, [facility] 02/16/2014 11:14 AM

## 2014-03-05 ENCOUNTER — Other Ambulatory Visit: Payer: Self-pay | Admitting: Family Medicine

## 2014-03-24 ENCOUNTER — Encounter: Payer: Self-pay | Admitting: Family Medicine

## 2014-03-24 ENCOUNTER — Other Ambulatory Visit: Payer: Self-pay | Admitting: Family Medicine

## 2014-03-24 NOTE — Telephone Encounter (Signed)
Medication refill for one time only.  Patient needs to be seen.  Letter sent for patient to call and schedule 

## 2014-04-04 ENCOUNTER — Telehealth: Payer: Self-pay | Admitting: Family Medicine

## 2014-04-04 NOTE — Telephone Encounter (Signed)
Patient is calling to say that he thinks he was told after he go finished taking his gout medication that he would need to take something to prevent the gout from coming back  [phone]

## 2014-04-04 NOTE — Telephone Encounter (Signed)
Saw MBD.  Be glad to see and discuss further.

## 2014-04-05 MED ORDER — ALLOPURINOL 100 MG PO TABS: 100.0000 mg | ORAL_TABLET | Freq: Every day | ORAL | Status: DC

## 2014-04-05 MED ORDER — COLCHICINE 0.6 MG PO TABS: 0.6000 mg | ORAL_TABLET | Freq: Two times a day (BID) | ORAL | Status: DC

## 2014-04-05 NOTE — Telephone Encounter (Signed)
Med sent to pharm and pt aware 

## 2014-04-05 NOTE — Telephone Encounter (Signed)
Tell him that when we initiate the medication to help prevent future gout flares,  he will need to stay on the Colcrys for the first  3-6 months while taking this new medication.   Send Rx for: Allopurinol 100 mg one po QD # 30 + 5 refills. Send Rx for Colcrys 0.6mg  one po BID  # 60 + 5 Refills. Tell him to schedule OV for 5 -6 months from now to f/u.  Schedule OV sooner if needs further discussion of meds or if any problems.

## 2014-05-02 ENCOUNTER — Other Ambulatory Visit: Payer: Self-pay | Admitting: Family Medicine

## 2014-05-08 IMAGING — CT CT ABD-PELV W/O CM
2 of 5 series · 17 of 46 positions shown, 19 images · non-contrast
Comparison: 01/13/2007.

CLINICAL DATA: Left flank pain.  History of kidney stones.

CT ABDOMEN AND PELVIS WITHOUT CONTRAST
TECHNIQUE: Multidetector CT imaging of the abdomen and pelvis was
performed following the standard protocol without intravenous
contrast.

[thins · axial · 0.81mm/px · z∈[+536,+972]mm · 14 of 475 slices shown, 16 images]
[im 19/475  soft-tissue]
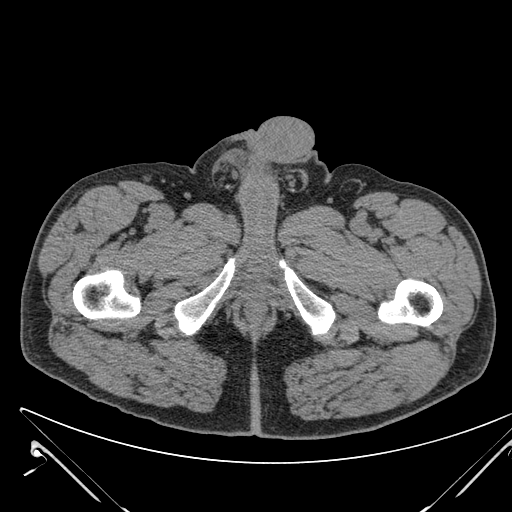
[im 19/475  bone]
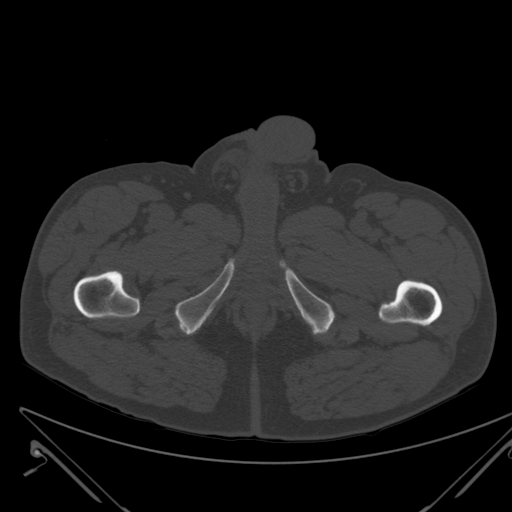
[im 57/475  soft-tissue]
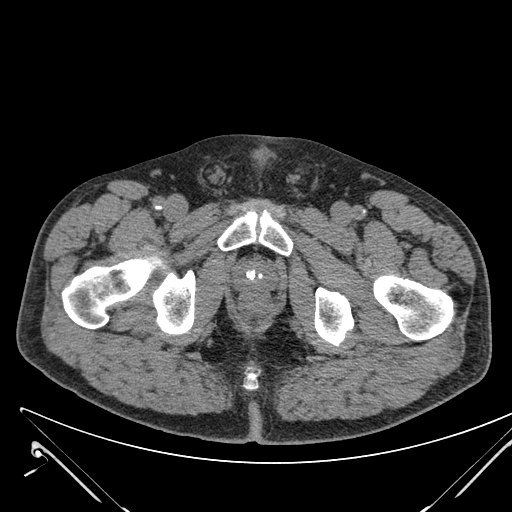
[im 95/475  soft-tissue]
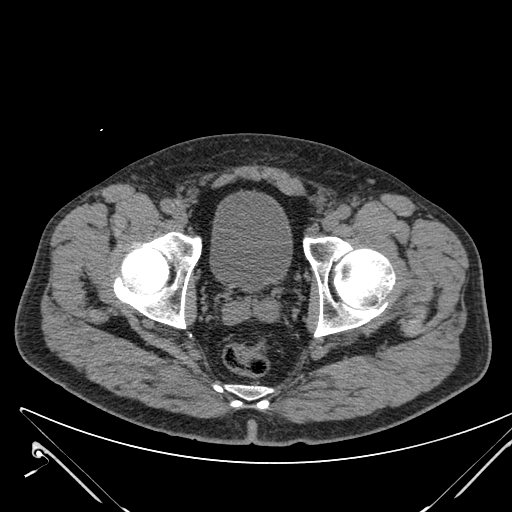
[im 133/475  soft-tissue]
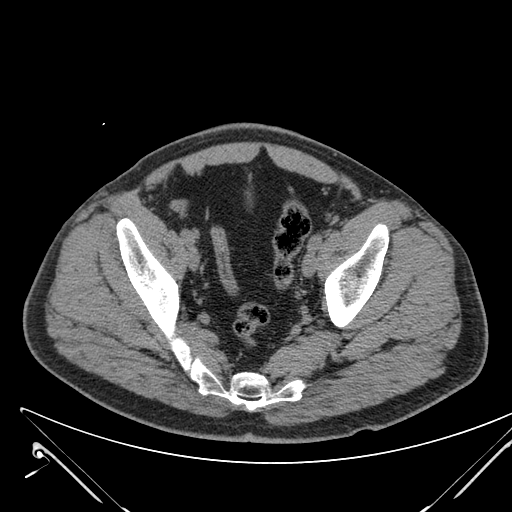
[im 152/475  soft-tissue]
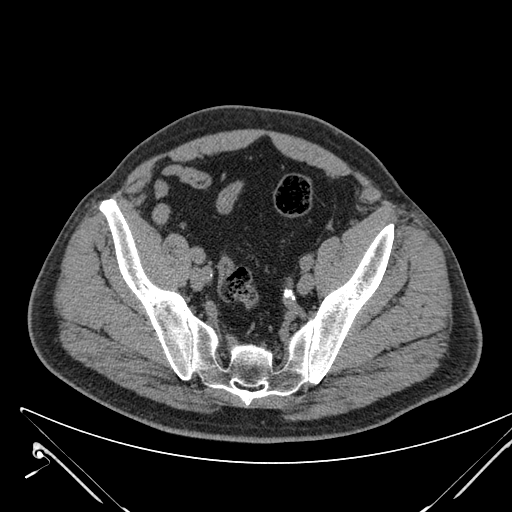
[im 190/475  soft-tissue]
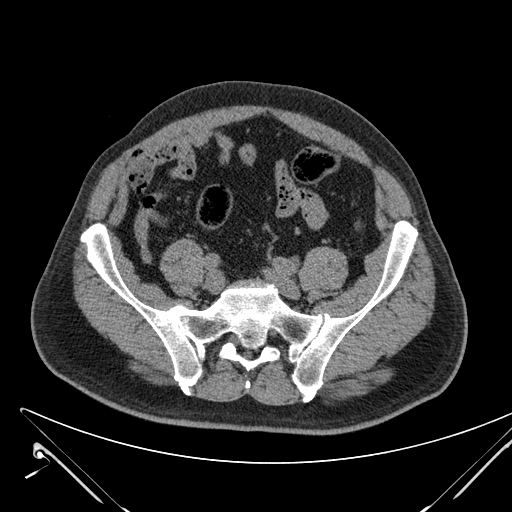
[im 228/475  soft-tissue]
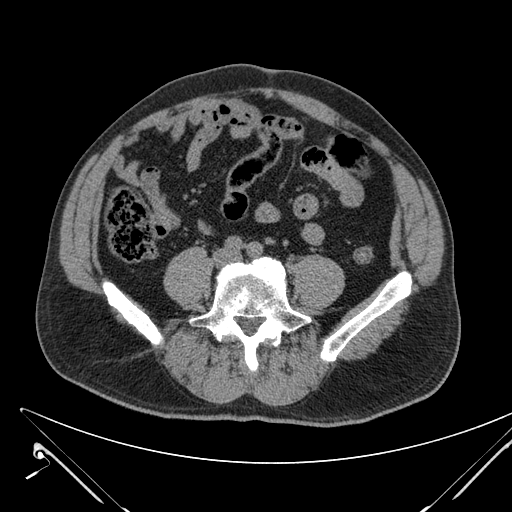
[im 247/475  soft-tissue]
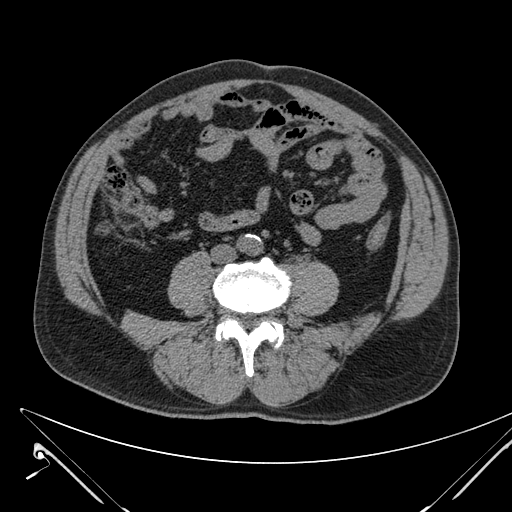
[im 285/475  soft-tissue]
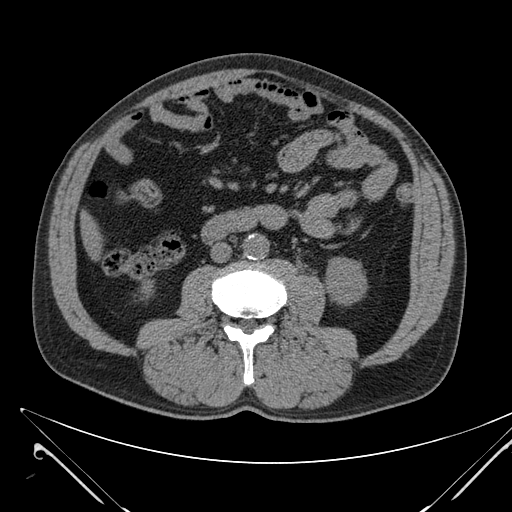
[im 285/475  bone]
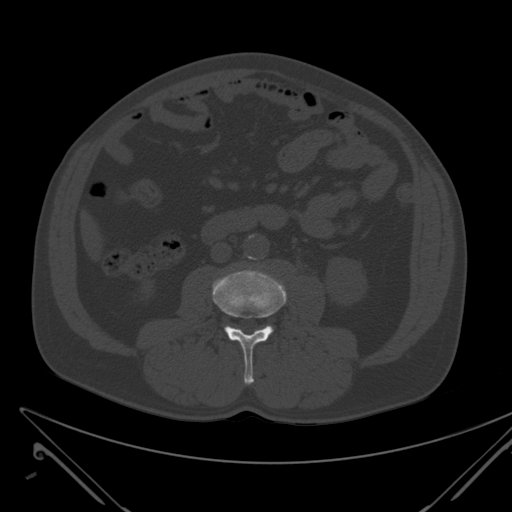
[im 323/475  soft-tissue]
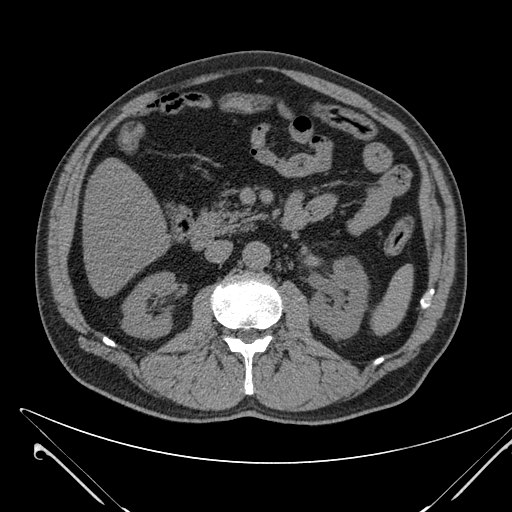
[im 361/475  soft-tissue]
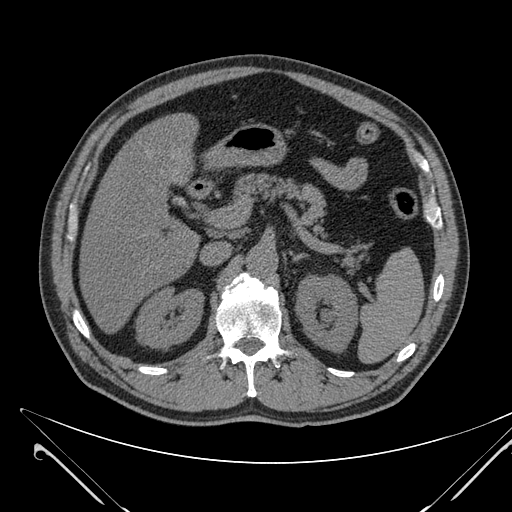
[im 380/475  soft-tissue]
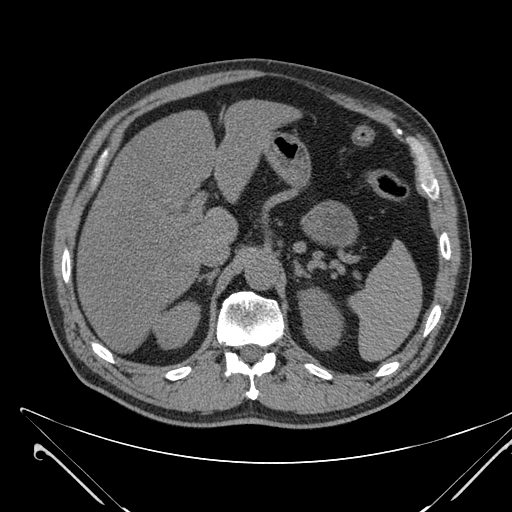
[im 418/475  soft-tissue]
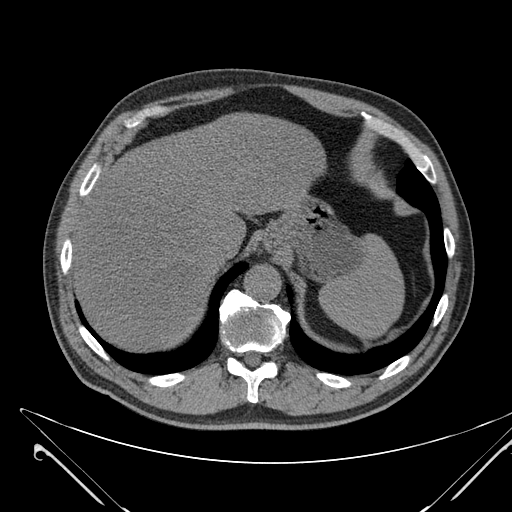
[im 456/475  soft-tissue]
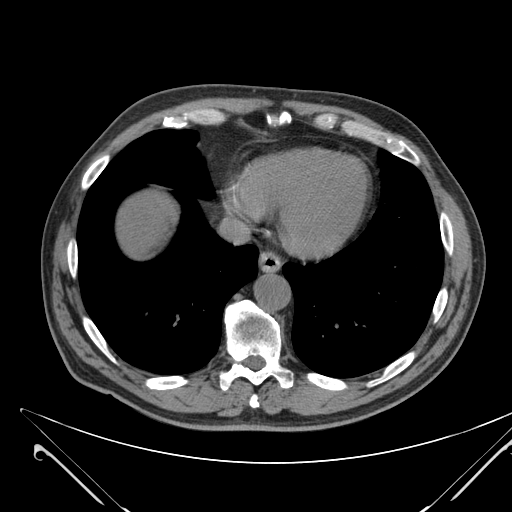

[coronal · coronal · 0.93mm/px · 3 of 93 slices shown]
[im 31/93  soft-tissue]
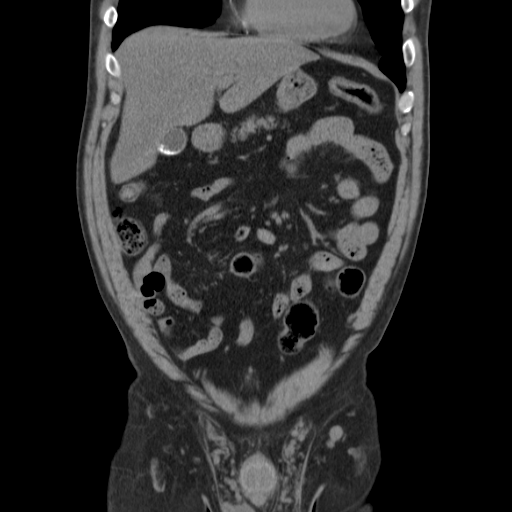
[im 41/93  soft-tissue]
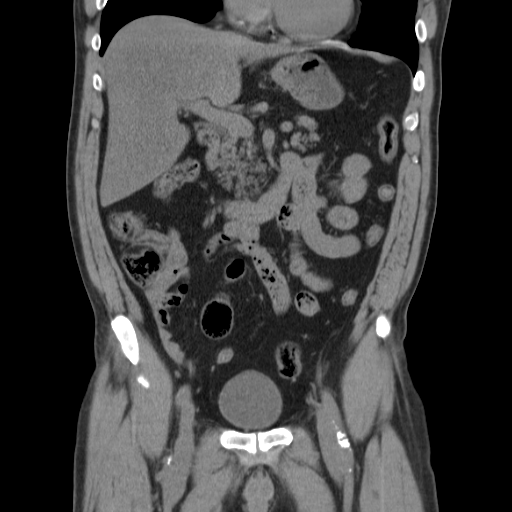
[im 52/93  soft-tissue]
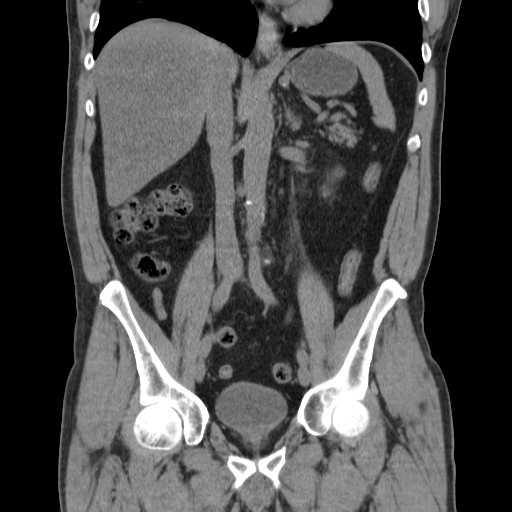

[17 of 46 positions shown; findings below may reference images not displayed]

FINDINGS: Left-sided hydronephrosis with distal left ureteral
mm calculus located 1 cm proximal to the left ureteral vesicle
junction.  There is also a 1 mm calculus 2 cm proximal to this
region.

Fatty infiltration of the liver. Evaluation of solid abdominal
viscera is limited by lack of IV contrast.  Taking this limitation
into account no worrisome focal hepatic, pancreatic, splenic,
adrenal or renal lesion.  Left renal 1.5 cm cyst.

Calcification of the gallbladder.  Some these represent dependent
gallstones.  Calcified gallbladder wall not excluded.

Small hiatal hernia.  No extraluminal bowel inflammatory process,
free fluid or free air.

Prominent coronary artery calcifications.  Calcification of the
aorta which is mildly ectatic without focal aneurysmal dilation.
Calcification iliac vessels.

Degenerative changes lumbar spine.  No bony destructive lesion.

No hernia noted.
IMPRESSION: Left-sided hydronephrosis with distal left ureteral 3.5 mm calculus
located 1 cm proximal to the left ureteral vesicle junction.  There
is also a 1 mm calculus 2 cm proximal to this region.

Please see above for additional findings.

## 2014-08-25 ENCOUNTER — Other Ambulatory Visit: Payer: Self-pay | Admitting: Family Medicine

## 2014-08-27 NOTE — Telephone Encounter (Signed)
Refill appropriate and filled per protocol. 

## 2014-09-21 ENCOUNTER — Other Ambulatory Visit: Payer: Self-pay | Admitting: Family Medicine

## 2014-09-21 NOTE — Telephone Encounter (Signed)
Refill appropriate and filled per protocol. 

## 2014-09-24 ENCOUNTER — Other Ambulatory Visit: Payer: Self-pay | Admitting: Family Medicine

## 2014-10-15 ENCOUNTER — Other Ambulatory Visit: Payer: Self-pay | Admitting: Physician Assistant

## 2014-10-17 ENCOUNTER — Encounter: Payer: Self-pay | Admitting: Family Medicine

## 2014-10-17 NOTE — Telephone Encounter (Signed)
Medication refill for one time only.  Patient needs to be seen.  Letter sent for patient to call and schedule 

## 2014-12-02 ENCOUNTER — Other Ambulatory Visit: Payer: Self-pay | Admitting: Family Medicine

## 2014-12-05 ENCOUNTER — Other Ambulatory Visit: Payer: Self-pay | Admitting: Family Medicine

## 2014-12-05 NOTE — Telephone Encounter (Signed)
Medication refill for one time only.  Patient needs to be seen.  Letter sent for patient to call and schedule 

## 2014-12-07 ENCOUNTER — Other Ambulatory Visit: Payer: Self-pay | Admitting: Family Medicine

## 2014-12-07 NOTE — Telephone Encounter (Signed)
Medication filled x1 with no refills.   Requires office visit before any further refills can be given.   Letter sent.  

## 2014-12-22 ENCOUNTER — Encounter: Payer: Self-pay | Admitting: Family Medicine

## 2014-12-22 ENCOUNTER — Ambulatory Visit (INDEPENDENT_AMBULATORY_CARE_PROVIDER_SITE_OTHER): Payer: Medicare PPO | Admitting: Family Medicine

## 2014-12-22 VITALS — BP 142/80 | HR 60 | Temp 97.8°F | Resp 22 | Ht 72.0 in | Wt 206.0 lb

## 2014-12-22 DIAGNOSIS — Z125 Encounter for screening for malignant neoplasm of prostate: Secondary | ICD-10-CM | POA: Diagnosis not present

## 2014-12-22 DIAGNOSIS — E119 Type 2 diabetes mellitus without complications: Secondary | ICD-10-CM | POA: Diagnosis not present

## 2014-12-22 DIAGNOSIS — Z23 Encounter for immunization: Secondary | ICD-10-CM

## 2014-12-22 LAB — LIPID PANEL
Cholesterol: 117 mg/dL (ref 0–200)
HDL: 28 mg/dL — ABNORMAL LOW (ref >=40)
LDL Cholesterol: 37 mg/dL (ref 0–99)
TRIGLYCERIDES: 260 mg/dL — AB (ref <150)
Total CHOL/HDL Ratio: 4.2 Ratio
VLDL: 52 mg/dL — AB (ref 0–40)

## 2014-12-22 LAB — COMPLETE METABOLIC PANEL WITH GFR
ALBUMIN: 4.5 g/dL (ref 3.5–5.2)
ALT: 38 U/L (ref 0–53)
AST: 27 U/L (ref 0–37)
Alkaline Phosphatase: 118 U/L — ABNORMAL HIGH (ref 39–117)
BUN: 17 mg/dL (ref 6–23)
CALCIUM: 9.4 mg/dL (ref 8.4–10.5)
CHLORIDE: 104 meq/L (ref 96–112)
CO2: 28 meq/L (ref 19–32)
Creat: 1.12 mg/dL (ref 0.50–1.35)
GFR, Est African American: 77 mL/min
GFR, Est Non African American: 66 mL/min
Glucose, Bld: 123 mg/dL — ABNORMAL HIGH (ref 70–99)
POTASSIUM: 4.5 meq/L (ref 3.5–5.3)
SODIUM: 139 meq/L (ref 135–145)
Total Bilirubin: 0.9 mg/dL (ref 0.2–1.2)
Total Protein: 7.1 g/dL (ref 6.0–8.3)

## 2014-12-22 LAB — CBC WITH DIFFERENTIAL/PLATELET
BASOS ABS: 0 10*3/uL (ref 0.0–0.1)
Basophils Relative: 1 % (ref 0–1)
EOS PCT: 5 % (ref 0–5)
Eosinophils Absolute: 0.2 10*3/uL (ref 0.0–0.7)
HCT: 45 % (ref 39.0–52.0)
HEMOGLOBIN: 14.7 g/dL (ref 13.0–17.0)
LYMPHS PCT: 29 % (ref 12–46)
Lymphs Abs: 1.2 10*3/uL (ref 0.7–4.0)
MCH: 29.6 pg (ref 26.0–34.0)
MCHC: 32.7 g/dL (ref 30.0–36.0)
MCV: 90.5 fL (ref 78.0–100.0)
MPV: 11.5 fL (ref 8.6–12.4)
Monocytes Absolute: 0.5 10*3/uL (ref 0.1–1.0)
Monocytes Relative: 12 % (ref 3–12)
NEUTROS ABS: 2.3 10*3/uL (ref 1.7–7.7)
NEUTROS PCT: 53 % (ref 43–77)
Platelets: 153 10*3/uL (ref 150–400)
RBC: 4.97 MIL/uL (ref 4.22–5.81)
RDW: 14.8 % (ref 11.5–15.5)
WBC: 4.3 10*3/uL (ref 4.0–10.5)

## 2014-12-22 LAB — HEMOGLOBIN A1C
HEMOGLOBIN A1C: 6.6 % — AB (ref <5.7)
Mean Plasma Glucose: 143 mg/dL — ABNORMAL HIGH (ref <117)

## 2014-12-22 NOTE — Progress Notes (Signed)
Subjective:    Patient ID: [name], male    DOB: Nov 02, 1945, 69 y.o.   MRN: [id number]  HPI Patient is here today for follow-up of his diabetes, his hypertension, and his hyperlipidemia. He recently had an attack of vertigo earlier in the week triggered by changes in position. This has slowly improved although he still feels slightly nauseated. Overall he has been doing well. He is retired from [employer] but does golf on a regular basis. He denies any chest pain shortness of breath or dyspnea on exertion. He is due for Pneumovax 23. He denies any myalgias or right upper quadrant pain on Lipitor. He denies any polyuria, polydipsia, or blurred vision. He is overdue for a PSA Past Medical History  Diagnosis Date  . Diabetes mellitus without complication   . Hypertension   . Kidney stones   . Hyperlipidemia   . Thyroid disease     hypothyroidism   Past Surgical History  Procedure Laterality Date  . Hemorrhoid surgery    . Appendectomy     Current Outpatient Prescriptions on File Prior to Visit  Medication Sig Dispense Refill  . allopurinol (ZYLOPRIM) 100 MG tablet TAKE 1 TABLET BY MOUTH EVERY DAY 30 tablet 0  . amLODipine (NORVASC) 5 MG tablet TAKE 1 TABLET BY MOUTH ONCE A DAY 30 tablet 5  . aspirin EC 81 MG tablet Take 81 mg by mouth daily.    Marland Kitchen. atorvastatin (LIPITOR) 40 MG tablet TAKE 1 TABLET BY MOUTH ONCE A DAY 30 tablet 5  . atorvastatin (LIPITOR) 40 MG tablet TAKE 1 TABLET BY MOUTH EVERY DAY 30 tablet 3  . benazepril (LOTENSIN) 40 MG tablet TAKE 1 TABLET BY MOUTH ONCE A DAY 30 tablet 6  . colchicine 0.6 MG tablet Take 1 tablet (0.6 mg total) by mouth 2 (two) times daily. 60 tablet 5  . levothyroxine (SYNTHROID, LEVOTHROID) 125 MCG tablet TAKE 1 TABLET BY MOUTH ONCE A DAY 30 tablet 5  . metFORMIN (GLUCOPHAGE) 500 MG tablet TAKE 1 TABLET BY MOUTH TWICE A DAY WITH A MEAL 60 tablet 0  . sildenafil (VIAGRA) 100 MG tablet Take 0.5-1 tablets (50-100 mg total) by mouth daily as  needed for erectile dysfunction. 5 tablet 11   No current facility-administered medications on file prior to visit.   No Known Allergies History   Social History  . Marital Status: Married    Spouse Name: N/A  . Number of Children: N/A  . Years of Education: N/A   Occupational History  . Not on file.   Social History Main Topics  . Smoking status: Former Games developermoker  . Smokeless tobacco: Former NeurosurgeonUser  . Alcohol Use: No  . Drug Use: No  . Sexual Activity: Not on file     Comment: separated from wife,    Other Topics Concern  . Not on file   Social History Narrative      Review of Systems  All other systems reviewed and are negative.      Objective:   Physical Exam  Constitutional: He appears well-developed and well-nourished. No distress.  Cardiovascular: Normal rate, regular rhythm, normal heart sounds and intact distal pulses.   No murmur heard. Pulmonary/Chest: Effort normal and breath sounds normal. No respiratory distress. He has no wheezes. He has no rales.  Abdominal: Soft. Bowel sounds are normal. He exhibits no distension. There is no tenderness. There is no rebound and no guarding.  Musculoskeletal: He exhibits no edema.  Skin: He is not  diaphoretic.  Vitals reviewed.         Assessment & Plan:  Diabetes mellitus type II, controlled - Plan: COMPLETE METABOLIC PANEL WITH GFR, CBC with Differential/Platelet, Lipid panel, Hemoglobin A1c  Prostate cancer screening - Plan: PSA, Medicare  Patient's blood pressures borderline. I've asked him to check his blood pressure frequently and notify me of the values in the next week or so. If blood pressures consistently greater than 140 systolic I would increase Norvasc to 10 mg a day. I will check a hemoglobin A1c. Goal hemoglobin A1c is less than 6.5. I will also check a fasting lipid panel. Goal LDL cholesterol is less than 100. Patient is taking an aspirin. Patient received Pneumovax 23 today. While I'm drawing blood  work I will also check a PSA. Patient declines a digital rectal exam

## 2014-12-22 NOTE — Addendum Note (Signed)
Addended by: Legrand RamsWILLIS, Kessa Fairbairn B on: 12/22/2014 09:06 AM   Modules accepted: Orders

## 2014-12-23 LAB — PSA, MEDICARE: PSA: 0.59 ng/mL (ref <=4.00)

## 2014-12-26 ENCOUNTER — Encounter: Payer: Self-pay | Admitting: Family Medicine

## 2014-12-31 ENCOUNTER — Other Ambulatory Visit: Payer: Self-pay | Admitting: Family Medicine

## 2015-01-17 ENCOUNTER — Other Ambulatory Visit: Payer: Self-pay | Admitting: Physician Assistant

## 2015-01-17 NOTE — Telephone Encounter (Signed)
Medication refill per protocol °

## 2015-02-06 ENCOUNTER — Other Ambulatory Visit: Payer: Self-pay | Admitting: Family Medicine

## 2015-04-08 ENCOUNTER — Other Ambulatory Visit: Payer: Self-pay | Admitting: Family Medicine

## 2015-04-09 NOTE — Telephone Encounter (Signed)
Medication filled x1 with no refills.   Requires office visit before any further refills can be given.   Letter sent.  

## 2015-04-17 ENCOUNTER — Other Ambulatory Visit: Payer: Self-pay | Admitting: Family Medicine

## 2015-04-17 NOTE — Telephone Encounter (Signed)
Refill appropriate and filled per protocol. 

## 2015-04-20 ENCOUNTER — Other Ambulatory Visit: Payer: Self-pay | Admitting: Family Medicine

## 2015-04-20 NOTE — Telephone Encounter (Signed)
Medication refilled per protocol. 

## 2015-05-10 ENCOUNTER — Encounter: Payer: Self-pay | Admitting: Family Medicine

## 2015-05-10 ENCOUNTER — Ambulatory Visit (INDEPENDENT_AMBULATORY_CARE_PROVIDER_SITE_OTHER): Payer: Medicare PPO | Admitting: Family Medicine

## 2015-05-10 VITALS — BP 118/70 | HR 60 | Temp 97.7°F | Resp 16 | Ht 72.0 in | Wt 202.0 lb

## 2015-05-10 DIAGNOSIS — E119 Type 2 diabetes mellitus without complications: Secondary | ICD-10-CM | POA: Diagnosis not present

## 2015-05-10 DIAGNOSIS — I10 Essential (primary) hypertension: Secondary | ICD-10-CM | POA: Diagnosis not present

## 2015-05-10 DIAGNOSIS — E785 Hyperlipidemia, unspecified: Secondary | ICD-10-CM | POA: Diagnosis not present

## 2015-05-10 LAB — LIPID PANEL
CHOLESTEROL: 115 mg/dL — AB (ref 125–200)
HDL: 30 mg/dL — ABNORMAL LOW (ref >=40)
LDL Cholesterol: 44 mg/dL (ref <130)
Total CHOL/HDL Ratio: 3.8 Ratio (ref <=5.0)
Triglycerides: 204 mg/dL — ABNORMAL HIGH (ref <150)
VLDL: 41 mg/dL — ABNORMAL HIGH (ref <30)

## 2015-05-10 LAB — COMPLETE METABOLIC PANEL WITH GFR
ALBUMIN: 4.5 g/dL (ref 3.6–5.1)
ALK PHOS: 107 U/L (ref 40–115)
ALT: 37 U/L (ref 9–46)
AST: 35 U/L (ref 10–35)
BUN: 18 mg/dL (ref 7–25)
CALCIUM: 9.3 mg/dL (ref 8.6–10.3)
CHLORIDE: 102 mmol/L (ref 98–110)
CO2: 25 mmol/L (ref 20–31)
CREATININE: 1.12 mg/dL (ref 0.70–1.18)
GFR, Est African American: 76 mL/min (ref >=60)
GFR, Est Non African American: 66 mL/min (ref >=60)
Glucose, Bld: 141 mg/dL — ABNORMAL HIGH (ref 70–99)
Potassium: 4.4 mmol/L (ref 3.5–5.3)
Sodium: 136 mmol/L (ref 135–146)
Total Bilirubin: 1 mg/dL (ref 0.2–1.2)
Total Protein: 7.2 g/dL (ref 6.1–8.1)

## 2015-05-10 LAB — HEMOGLOBIN A1C
HEMOGLOBIN A1C: 6.6 % — AB (ref <5.7)
MEAN PLASMA GLUCOSE: 143 mg/dL — AB (ref <117)

## 2015-05-10 NOTE — Progress Notes (Signed)
Subjective:    Patient ID: [name], male    DOB: 09/30/1945, 69 y.o.   MRN: [id number]  HPI 12/22/14 Patient is here today for follow-up of his diabetes, his hypertension, and his hyperlipidemia. He recently had an attack of vertigo earlier in the week triggered by changes in position. This has slowly improved although he still feels slightly nauseated. Overall he has been doing well. He is retired from [employer] but does golf on a regular basis. He denies any chest pain shortness of breath or dyspnea on exertion. He is due for Pneumovax 23. He denies any myalgias or right upper quadrant pain on Lipitor. He denies any polyuria, polydipsia, or blurred vision. He is overdue for a PSA.  At that time, my plan was: Patient's blood pressures borderline. I've asked him to check his blood pressure frequently and notify me of the values in the next week or so. If blood pressures consistently greater than 140 systolic I would increase Norvasc to 10 mg a day. I will check a hemoglobin A1c. Goal hemoglobin A1c is less than 6.5. I will also check a fasting lipid panel. Goal LDL cholesterol is less than 100. Patient is taking an aspirin. Patient received Pneumovax 23 today. While I'm drawing blood work I will also check a PSA. Patient declines a digital rectal exam  05/10/15 He is here today for follow-up. Labs in April were significant for low HDL cholesterol, for which I recommended increasing aerobic exercise. The remainder of his labs were normal.  He has not been exercising. He admits that he is very sedentary. He denies any chest pain shortness of breath or dyspnea on exertion. He denies any polyuria, polydipsia, or blurred vision. He denies any myalgias or right upper quadrant pain. PSA is within normal limits. All of his immunizations are up-to-date. He is due for colonoscopy. However he refuses a colonoscopy. He also refuses fecal occult blood cards. Past Medical History  Diagnosis Date  . Diabetes  mellitus without complication   . Hypertension   . Kidney stones   . Hyperlipidemia   . Thyroid disease     hypothyroidism   Past Surgical History  Procedure Laterality Date  . Hemorrhoid surgery    . Appendectomy     Current Outpatient Prescriptions on File Prior to Visit  Medication Sig Dispense Refill  . allopurinol (ZYLOPRIM) 100 MG tablet TAKE 1 TABLET BY MOUTH EVERY DAY 30 tablet 0  . amLODipine (NORVASC) 5 MG tablet TAKE 1 TABLET BY MOUTH ONCE A DAY 30 tablet 5  . aspirin EC 81 MG tablet Take 81 mg by mouth daily.    Marland Kitchen atorvastatin (LIPITOR) 40 MG tablet TAKE 1 TABLET BY MOUTH ONCE A DAY 30 tablet 5  . atorvastatin (LIPITOR) 40 MG tablet TAKE 1 TABLET BY MOUTH EVERY DAY 30 tablet 3  . benazepril (LOTENSIN) 40 MG tablet TAKE 1 TABLET BY MOUTH ONCE A DAY 30 tablet 2  . colchicine 0.6 MG tablet TAKE 1 TABLET BY MOUTH TWICE A DAY 60 tablet 5  . levothyroxine (SYNTHROID, LEVOTHROID) 125 MCG tablet TAKE 1 TABLET BY MOUTH ONCE A DAY 30 tablet 0  . metFORMIN (GLUCOPHAGE) 500 MG tablet TAKE 1 TABLET BY MOUTH TWICE A DAY WITH A MEAL 60 tablet 5  . sildenafil (VIAGRA) 100 MG tablet Take 0.5-1 tablets (50-100 mg total) by mouth daily as needed for erectile dysfunction. 5 tablet 11   No current facility-administered medications on file prior to visit.   No  Known Allergies Social History   Social History  . Marital Status: Married    Spouse Name: N/A  . Number of Children: N/A  . Years of Education: N/A   Occupational History  . Not on file.   Social History Main Topics  . Smoking status: Former Games developer  . Smokeless tobacco: Former Neurosurgeon  . Alcohol Use: No  . Drug Use: No  . Sexual Activity: Not on file     Comment: separated from wife,    Other Topics Concern  . Not on file   Social History Narrative      Review of Systems  All other systems reviewed and are negative.      Objective:   Physical Exam  Constitutional: He appears well-developed and well-nourished.  No distress.  Cardiovascular: Normal rate, regular rhythm, normal heart sounds and intact distal pulses.   No murmur heard. Pulmonary/Chest: Effort normal and breath sounds normal. No respiratory distress. He has no wheezes. He has no rales.  Abdominal: Soft. Bowel sounds are normal. He exhibits no distension. There is no tenderness. There is no rebound and no guarding.  Musculoskeletal: He exhibits no edema.  Skin: He is not diaphoretic.  Vitals reviewed.         Assessment & Plan:  Diabetes mellitus type II, controlled - Plan: COMPLETE METABOLIC PANEL WITH GFR, Hemoglobin A1c, Lipid panel, Microalbumin, urine  HLD (hyperlipidemia)  Essential hypertension  Blood pressure is acceptable. I will make no changes in his blood pressure medication. I will check a hemoglobin A1c. Goal hemoglobin A1c is less than 6.5. I will also check a fasting lipid panel. Goal LDL cholesterol is less than 100. I encouraged increasing aerobic exercise eventually to a goal of 30 minutes a day 5 days a week. Also recommended a colonoscopy which the patient refused. Immunizations are up-to-date. The remainder of his cancer screening is up-to-date.

## 2015-05-11 ENCOUNTER — Other Ambulatory Visit: Payer: Self-pay | Admitting: Family Medicine

## 2015-05-11 NOTE — Telephone Encounter (Signed)
Medication refilled per protocol. 

## 2015-05-15 ENCOUNTER — Encounter: Payer: Self-pay | Admitting: Family Medicine

## 2015-06-27 ENCOUNTER — Other Ambulatory Visit: Payer: Self-pay | Admitting: Family Medicine

## 2015-06-28 ENCOUNTER — Encounter: Payer: Self-pay | Admitting: Family Medicine

## 2015-06-28 ENCOUNTER — Other Ambulatory Visit: Payer: Self-pay | Admitting: Family Medicine

## 2015-06-28 DIAGNOSIS — Z79899 Other long term (current) drug therapy: Secondary | ICD-10-CM

## 2015-06-28 DIAGNOSIS — E039 Hypothyroidism, unspecified: Secondary | ICD-10-CM

## 2015-06-28 NOTE — Telephone Encounter (Signed)
ok 

## 2015-06-28 NOTE — Telephone Encounter (Signed)
Medication refill for one time only.  Patient needs to be seen for TSH level.  Letter sent for patient to call and schedule.

## 2015-07-15 ENCOUNTER — Other Ambulatory Visit: Payer: Self-pay | Admitting: Family Medicine

## 2015-07-27 ENCOUNTER — Other Ambulatory Visit: Payer: Self-pay | Admitting: Family Medicine

## 2015-08-01 ENCOUNTER — Other Ambulatory Visit: Payer: Self-pay | Admitting: Family Medicine

## 2015-08-01 NOTE — Telephone Encounter (Signed)
Medication refilled per protocol. 

## 2015-08-02 ENCOUNTER — Ambulatory Visit (INDEPENDENT_AMBULATORY_CARE_PROVIDER_SITE_OTHER): Payer: Medicare PPO | Admitting: Family Medicine

## 2015-08-02 ENCOUNTER — Encounter: Payer: Self-pay | Admitting: Family Medicine

## 2015-08-02 DIAGNOSIS — E038 Other specified hypothyroidism: Secondary | ICD-10-CM

## 2015-08-02 NOTE — Progress Notes (Signed)
   Subjective:    Patient ID: [name], male    DOB: 1946-05-24, 69 y.o.   MRN: [id number]  HPI Is currently on levothyroxine 125 g by mouth daily. I have been delinquent in monitoring his TSH. It has been well over a year since I checked his TSH. I have asked the patient to come in today so that I can check his TSH. He denies any fatigue. He denies any rapid hair loss. He denies any sudden weight gain. He denies any heat or cold intolerance. He denies any palpitations.  He denies stridor. He denies any tremor. Past Medical History  Diagnosis Date  . Diabetes mellitus without complication (HCC)   . Hypertension   . Kidney stones   . Hyperlipidemia   . Thyroid disease     hypothyroidism   Past Surgical History  Procedure Laterality Date  . Hemorrhoid surgery    . Appendectomy     Current Outpatient Prescriptions on File Prior to Visit  Medication Sig Dispense Refill  . allopurinol (ZYLOPRIM) 100 MG tablet TAKE 1 TABLET BY MOUTH EVERY DAY 30 tablet 0  . amLODipine (NORVASC) 5 MG tablet TAKE 1 TABLET BY MOUTH ONCE A DAY 30 tablet 5  . aspirin EC 81 MG tablet Take 81 mg by mouth daily.    Marland Kitchen. atorvastatin (LIPITOR) 40 MG tablet TAKE 1 TABLET BY MOUTH ONCE A DAY 30 tablet 5  . benazepril (LOTENSIN) 40 MG tablet TAKE 1 TABLET BY MOUTH ONCE A DAY 30 tablet 11  . colchicine 0.6 MG tablet TAKE 1 TABLET BY MOUTH TWICE A DAY 60 tablet 5  . levothyroxine (SYNTHROID, LEVOTHROID) 125 MCG tablet TAKE 1 TABLET BY MOUTH ONCE A DAY 30 tablet 11  . metFORMIN (GLUCOPHAGE) 500 MG tablet TAKE 1 TABLET BY MOUTH TWICE A DAY WITH A MEAL 60 tablet 5  . VIAGRA 100 MG tablet TAKE 1/2 TO 1 TABLET BY MOUTH EVERY DAY AS NEEDED FOR ERECTILE DYSFUNCTION 5 tablet 0   No current facility-administered medications on file prior to visit.   No Known Allergies Social History   Social History  . Marital Status: Married    Spouse Name: N/A  . Number of Children: N/A  . Years of Education: N/A   Occupational  History  . Not on file.   Social History Main Topics  . Smoking status: Former Games developermoker  . Smokeless tobacco: Former NeurosurgeonUser  . Alcohol Use: No  . Drug Use: No  . Sexual Activity: Not on file     Comment: separated from wife,    Other Topics Concern  . Not on file   Social History Narrative    .   Review of Systems  All other systems reviewed and are negative.      Objective:   Physical Exam  Constitutional: He appears well-developed and well-nourished.  Neck: Neck supple. No thyromegaly present.  Cardiovascular: Normal rate, regular rhythm and normal heart sounds.   Pulmonary/Chest: Effort normal and breath sounds normal. No stridor. No respiratory distress. He has no wheezes. He has no rales.  Abdominal: Soft. Bowel sounds are normal.  Lymphadenopathy:    He has no cervical adenopathy.  Vitals reviewed.         Assessment & Plan:  Other specified hypothyroidism - Plan: TSH  Patient is asymptomatic. Therefore I believe he is on the correct dosage. However I will check a TSH today. I recommended a flu shot but the patient declined

## 2015-08-03 ENCOUNTER — Other Ambulatory Visit: Payer: Self-pay | Admitting: Family Medicine

## 2015-08-03 DIAGNOSIS — R7989 Other specified abnormal findings of blood chemistry: Secondary | ICD-10-CM

## 2015-08-03 LAB — TSH: TSH: 9.802 u[IU]/mL — ABNORMAL HIGH (ref 0.350–4.500)

## 2015-08-03 MED ORDER — LEVOTHYROXINE SODIUM 150 MCG PO TABS: 150.0000 ug | ORAL_TABLET | Freq: Every day | ORAL | Status: DC

## 2015-08-14 ENCOUNTER — Ambulatory Visit: Payer: Medicare PPO | Admitting: Family Medicine

## 2015-08-29 ENCOUNTER — Telehealth: Payer: Self-pay | Admitting: Family Medicine

## 2015-08-29 MED ORDER — PREDNISONE 10 MG PO TABS: ORAL_TABLET | ORAL | Status: DC

## 2015-08-29 MED ORDER — COLCHICINE 0.6 MG PO TABS: 0.6000 mg | ORAL_TABLET | Freq: Two times a day (BID) | ORAL | Status: DC

## 2015-08-29 NOTE — Telephone Encounter (Signed)
Pt called and states that the colchicine has now gone up to $160 a month and he can not afford that and is wanting something else called in. He is taking Allopurinol 100mg  qd and he has been out of colchicine for about a week now and his toes is starting to swell and hurt. Recommendations??

## 2015-08-29 NOTE — Telephone Encounter (Addendum)
Pt called and states that he needs a refill of Colchicine, but it is too expensive with his insurance. He requested that we call in Colcrysa in its place because it is covered. Please advise [phone] OK to leave msg

## 2015-08-29 NOTE — Telephone Encounter (Signed)
(  FYI: Has diabetes, so want to limit dose of prednisone as much as possible) Prednisone 20mg  QD x 2 days then 10mg  QD x 2 days

## 2015-08-29 NOTE — Telephone Encounter (Signed)
Medication called/sent to requested pharmacy and pt aware via vm - Is there anything else he can take daily to help with gout flares since he can not afford the colchicine?????

## 2015-08-29 NOTE — Telephone Encounter (Signed)
Med sent to pharm to run as name brand as I think insurance may cover that way according to note from pt.

## 2015-08-30 NOTE — Telephone Encounter (Signed)
I would increase allopurinol to 200 mg poqday to drive uric acid lower and use indomethicin 50 mg pobid for gout flares.

## 2015-08-31 MED ORDER — ALLOPURINOL 100 MG PO TABS: 200.0000 mg | ORAL_TABLET | Freq: Every day | ORAL | Status: DC

## 2015-08-31 MED ORDER — INDOMETHACIN 50 MG PO CAPS: 50.0000 mg | ORAL_CAPSULE | Freq: Two times a day (BID) | ORAL | Status: DC

## 2015-08-31 NOTE — Telephone Encounter (Signed)
Med sent to pharm and pt aware 

## 2015-09-10 ENCOUNTER — Other Ambulatory Visit: Payer: Self-pay | Admitting: Family Medicine

## 2015-09-10 MED ORDER — BENAZEPRIL HCL 40 MG PO TABS: 40.0000 mg | ORAL_TABLET | Freq: Every day | ORAL | Status: DC

## 2015-09-10 MED ORDER — ALLOPURINOL 100 MG PO TABS: 200.0000 mg | ORAL_TABLET | Freq: Every day | ORAL | Status: DC

## 2015-09-10 MED ORDER — METFORMIN HCL 500 MG PO TABS: ORAL_TABLET | ORAL | Status: DC

## 2015-09-10 MED ORDER — LEVOTHYROXINE SODIUM 150 MCG PO TABS: 150.0000 ug | ORAL_TABLET | Freq: Every day | ORAL | Status: DC

## 2015-09-10 MED ORDER — ATORVASTATIN CALCIUM 40 MG PO TABS: 40.0000 mg | ORAL_TABLET | Freq: Every day | ORAL | Status: DC

## 2015-09-10 MED ORDER — AMLODIPINE BESYLATE 5 MG PO TABS: 5.0000 mg | ORAL_TABLET | Freq: Every day | ORAL | Status: DC

## 2015-09-10 NOTE — Telephone Encounter (Signed)
Medication refilled per protocol. 

## 2015-12-10 ENCOUNTER — Encounter: Payer: Self-pay | Admitting: Family Medicine

## 2015-12-10 ENCOUNTER — Ambulatory Visit (INDEPENDENT_AMBULATORY_CARE_PROVIDER_SITE_OTHER): Payer: Medicare PPO | Admitting: Family Medicine

## 2015-12-10 VITALS — BP 126/72 | HR 80 | Temp 97.9°F | Resp 18 | Ht 72.0 in | Wt 206.0 lb

## 2015-12-10 DIAGNOSIS — M545 Low back pain, unspecified: Secondary | ICD-10-CM

## 2015-12-10 MED ORDER — DICLOFENAC SODIUM 75 MG PO TBEC: 75.0000 mg | DELAYED_RELEASE_TABLET | Freq: Two times a day (BID) | ORAL | Status: DC

## 2015-12-10 MED ORDER — CYCLOBENZAPRINE HCL 10 MG PO TABS: 10.0000 mg | ORAL_TABLET | Freq: Three times a day (TID) | ORAL | Status: DC | PRN

## 2015-12-10 NOTE — Progress Notes (Signed)
Subjective:    Patient ID: [name], male    DOB: November 01, 1945, 70 y.o.   MRN: [id number]  HPI Patient develop lumbar lower back pain in the lumbar paraspinal muscles starting on Saturday after playing golf. The pain is worse rising from a seated position. Once he is up moving around the pain improves. After sleeping or after sitting for prolonged period of time, the pain seems worse. He denies any numbness or tingling radiating down the legs. He denies any pain radiating down the legs. He denies any saddle anesthesia or other symptoms of cauda equina syndrome area and he denies any dysuria or frequency or urgency. He denies any fever. Past Medical History  Diagnosis Date  . Diabetes mellitus without complication (HCC)   . Hypertension   . Kidney stones   . Hyperlipidemia   . Thyroid disease     hypothyroidism   Past Surgical History  Procedure Laterality Date  . Hemorrhoid surgery    . Appendectomy     Current Outpatient Prescriptions on File Prior to Visit  Medication Sig Dispense Refill  . allopurinol (ZYLOPRIM) 100 MG tablet Take 2 tablets (200 mg total) by mouth daily. 180 tablet 0  . amLODipine (NORVASC) 5 MG tablet Take 1 tablet (5 mg total) by mouth daily. 90 tablet 0  . aspirin EC 81 MG tablet Take 81 mg by mouth daily.    Marland Kitchen atorvastatin (LIPITOR) 40 MG tablet Take 1 tablet (40 mg total) by mouth daily. 90 tablet 0  . benazepril (LOTENSIN) 40 MG tablet Take 1 tablet (40 mg total) by mouth daily. 90 tablet 0  . levothyroxine (SYNTHROID, LEVOTHROID) 150 MCG tablet Take 1 tablet (150 mcg total) by mouth daily. 90 tablet 0  . metFORMIN (GLUCOPHAGE) 500 MG tablet TAKE 1 TABLET BY MOUTH TWICE A DAY WITH A MEAL 180 tablet 0  . VIAGRA 100 MG tablet TAKE 1/2 TO 1 TABLET BY MOUTH EVERY DAY AS NEEDED FOR ERECTILE DYSFUNCTION 5 tablet 0   No current facility-administered medications on file prior to visit.   No Known Allergies Social History   Social History  . Marital  Status: Married    Spouse Name: N/A  . Number of Children: N/A  . Years of Education: N/A   Occupational History  . Not on file.   Social History Main Topics  . Smoking status: Former Games developer  . Smokeless tobacco: Former Neurosurgeon  . Alcohol Use: No  . Drug Use: No  . Sexual Activity: Not on file     Comment: separated from wife,    Other Topics Concern  . Not on file   Social History Narrative      Review of Systems  All other systems reviewed and are negative.      Objective:   Physical Exam  Cardiovascular: Normal rate, regular rhythm and normal heart sounds.   Pulmonary/Chest: Effort normal and breath sounds normal.  Musculoskeletal:       Lumbar back: He exhibits decreased range of motion, tenderness, pain and spasm. He exhibits no bony tenderness.  Vitals reviewed.         Assessment & Plan:  Midline low back pain without sciatica - Plan: DG Lumbar Spine Complete, cyclobenzaprine (FLEXERIL) 10 MG tablet, diclofenac (VOLTAREN) 75 MG EC tablet  I believe the patient strained a muscle in his lower back while playing golf. I recommended tincture of time. Use diclofenac 75 mg by mouth twice a day as well as Flexeril 10 mg  every 8 hours as needed. Proceed with an x-ray of the lumbar spine. Recheck if no better in 1 week or sooner if worse

## 2015-12-11 ENCOUNTER — Ambulatory Visit: Payer: Medicare PPO | Admitting: Family Medicine

## 2015-12-16 ENCOUNTER — Other Ambulatory Visit: Payer: Self-pay | Admitting: Family Medicine

## 2015-12-17 NOTE — Telephone Encounter (Signed)
Refill appropriate and filled per protocol. 

## 2015-12-21 ENCOUNTER — Other Ambulatory Visit: Payer: Self-pay | Admitting: Family Medicine

## 2015-12-25 ENCOUNTER — Other Ambulatory Visit: Payer: Self-pay | Admitting: Family Medicine

## 2015-12-25 NOTE — Telephone Encounter (Signed)
Refill appropriate and filled per protocol. 

## 2016-01-10 ENCOUNTER — Other Ambulatory Visit: Payer: Self-pay | Admitting: Family Medicine

## 2016-01-10 NOTE — Telephone Encounter (Signed)
Refill appropriate and filled per protocol. 

## 2016-01-18 ENCOUNTER — Other Ambulatory Visit: Payer: Self-pay | Admitting: Family Medicine

## 2016-01-18 NOTE — Telephone Encounter (Signed)
Refill appropriate and filled per protocol. 

## 2016-01-22 ENCOUNTER — Other Ambulatory Visit: Payer: Self-pay | Admitting: Family Medicine

## 2016-03-23 ENCOUNTER — Other Ambulatory Visit: Payer: Self-pay | Admitting: Family Medicine

## 2016-03-30 ENCOUNTER — Other Ambulatory Visit: Payer: Self-pay | Admitting: Family Medicine

## 2016-04-17 ENCOUNTER — Other Ambulatory Visit: Payer: Self-pay | Admitting: Family Medicine

## 2016-04-17 NOTE — Telephone Encounter (Signed)
Refill appropriate and filled per protocol. 

## 2016-04-30 ENCOUNTER — Other Ambulatory Visit: Payer: Self-pay | Admitting: Family Medicine

## 2016-06-02 ENCOUNTER — Other Ambulatory Visit: Payer: Self-pay | Admitting: Family Medicine

## 2016-06-11 ENCOUNTER — Other Ambulatory Visit: Payer: Self-pay | Admitting: Family Medicine

## 2016-06-11 DIAGNOSIS — M545 Low back pain, unspecified: Secondary | ICD-10-CM

## 2016-06-12 NOTE — Telephone Encounter (Signed)
ok 

## 2016-06-12 NOTE — Telephone Encounter (Signed)
Ok to refill 

## 2016-06-12 NOTE — Telephone Encounter (Signed)
Prescription sent to pharmacy.

## 2016-06-24 ENCOUNTER — Ambulatory Visit (INDEPENDENT_AMBULATORY_CARE_PROVIDER_SITE_OTHER): Payer: Medicare PPO | Admitting: Family Medicine

## 2016-06-24 ENCOUNTER — Encounter: Payer: Self-pay | Admitting: Family Medicine

## 2016-06-24 VITALS — BP 146/88 | HR 76 | Temp 98.5°F | Resp 16 | Ht 72.0 in | Wt 209.0 lb

## 2016-06-24 DIAGNOSIS — M5432 Sciatica, left side: Secondary | ICD-10-CM | POA: Diagnosis not present

## 2016-06-24 MED ORDER — PREDNISONE 20 MG PO TABS: ORAL_TABLET | ORAL | 0 refills | Status: DC

## 2016-06-24 NOTE — Progress Notes (Signed)
Subjective:    Patient ID: [name], male    DOB: 03-May-1946, 70 y.o.   MRN: [id number]  HPI The patient reports 2-3 weeks of left-sided low back pain. Pain is gradually getting better but [name] has been taking diclofenac and ibuprofen together. We had a long discussion today about NSAIDs and I warned him against doing this due to the risk of GI toxicity and renal toxicity. [name] does not remember any specific injury. However it is worse when [name] bends forward. [name] is also getting pain radiating into his left hip and some tingling and numbness radiating down the lateral aspect of his left leg. [name] denies any saddle anesthesia. [name] denies any bowel or bladder incontinence. His neurologic exam today is completely normal Past Medical History:  Diagnosis Date  . Diabetes mellitus without complication (HCC)   . Hyperlipidemia   . Hypertension   . Kidney stones   . Thyroid disease    hypothyroidism   Past Surgical History:  Procedure Laterality Date  . APPENDECTOMY    . HEMORRHOID SURGERY     Current Outpatient Prescriptions on File Prior to Visit  Medication Sig Dispense Refill  . allopurinol (ZYLOPRIM) 100 MG tablet TAKE 2 TABLETS (200 MG TOTAL) BY MOUTH DAILY. 180 tablet 0  . amLODipine (NORVASC) 5 MG tablet TAKE 1 TABLET (5 MG TOTAL) BY MOUTH DAILY. 90 tablet 0  . aspirin EC 81 MG tablet Take 81 mg by mouth daily.    Marland Kitchen. atorvastatin (LIPITOR) 40 MG tablet TAKE 1 TABLET (40 MG TOTAL) BY MOUTH DAILY. 90 tablet 1  . benazepril (LOTENSIN) 40 MG tablet TAKE 1 TABLET (40 MG TOTAL) BY MOUTH DAILY. 90 tablet 0  . cyclobenzaprine (FLEXERIL) 10 MG tablet TAKE 1 TABLET (10 MG TOTAL) BY MOUTH 3 (THREE) TIMES DAILY AS NEEDED FOR MUSCLE SPASMS. 30 tablet 0  . diclofenac (VOLTAREN) 75 MG EC tablet TAKE 1 TABLET (75 MG TOTAL) BY MOUTH 2 (TWO) TIMES DAILY. 30 tablet 11  . levothyroxine (SYNTHROID, LEVOTHROID) 150 MCG tablet TAKE 1 TABLET (150 MCG TOTAL) BY MOUTH DAILY. 90 tablet 2  . metFORMIN (GLUCOPHAGE)  500 MG tablet TAKE 1 TABLET BY MOUTH TWICE A DAY WITH A MEAL 180 tablet 1  . VIAGRA 100 MG tablet TAKE 1/2 TO 1 TABLET BY MOUTH EVERY DAY AS NEEDED FOR ERECTILE DYSFUNCTION 5 tablet 0   No current facility-administered medications on file prior to visit.    No Known Allergies Social History   Social History  . Marital status: Married    Spouse name: N/A  . Number of children: N/A  . Years of education: N/A   Occupational History  . Not on file.   Social History Main Topics  . Smoking status: Former Games developermoker  . Smokeless tobacco: Former NeurosurgeonUser  . Alcohol use No  . Drug use: No  . Sexual activity: Not on file     Comment: separated from wife,    Other Topics Concern  . Not on file   Social History Narrative  . No narrative on file      Review of Systems  All other systems reviewed and are negative.      Objective:   Physical Exam  Cardiovascular: Normal rate, regular rhythm and normal heart sounds.   Pulmonary/Chest: Effort normal and breath sounds normal. No respiratory distress. [name] has no wheezes. [name] has no rales.  Musculoskeletal:       Lumbar back: [name] exhibits decreased range of motion, tenderness,  pain and spasm.  Neurological: [name] has normal reflexes. [name] exhibits normal muscle tone.  Vitals reviewed.         Assessment & Plan:  Left sided sciatica - Plan: predniSONE (DELTASONE) 20 MG tablet, DG Lumbar Spine Complete  The patient's exam is normal other than the muscle spasm and pain in his back. I suspect that is pulled and strained a lumbar paraspinal muscle. However a lot of his symptoms are also consistent with sciatica. Therefore I'm also concerned [name] may have herniated a disc. I recommended a prednisone taper pack. I recommended avoidance of NSAIDs while on prednisone. In the future [name] should only take one NSAID. I've also ordered an x-ray of his lumbar spine. Recheck in 2 weeks if no better or sooner if worse

## 2016-07-29 ENCOUNTER — Other Ambulatory Visit: Payer: Self-pay | Admitting: Family Medicine

## 2016-08-08 ENCOUNTER — Other Ambulatory Visit: Payer: Self-pay | Admitting: Family Medicine

## 2016-08-14 ENCOUNTER — Ambulatory Visit: Payer: Medicare PPO | Admitting: Family Medicine

## 2016-08-15 ENCOUNTER — Ambulatory Visit: Payer: Medicare PPO | Admitting: Family Medicine

## 2016-08-19 ENCOUNTER — Telehealth: Payer: Self-pay | Admitting: Family Medicine

## 2016-08-19 MED ORDER — PROMETHAZINE HCL 25 MG PO TABS: 25.0000 mg | ORAL_TABLET | Freq: Four times a day (QID) | ORAL | 0 refills | Status: DC | PRN

## 2016-08-19 NOTE — Telephone Encounter (Signed)
Phenergan 25 q 6 hrs prn (30)

## 2016-08-19 NOTE — Telephone Encounter (Signed)
Medication called/sent to requested pharmacy and pt aware 

## 2016-08-19 NOTE — Telephone Encounter (Signed)
Pt had would like something for nausea. He had a recent bout with vertigo and since then he has been very nauseated and was wondering if you would call him in something or does he need an OV?

## 2016-10-03 ENCOUNTER — Other Ambulatory Visit: Payer: Self-pay | Admitting: Family Medicine

## 2016-10-13 ENCOUNTER — Other Ambulatory Visit: Payer: Self-pay | Admitting: Family Medicine

## 2016-10-30 ENCOUNTER — Other Ambulatory Visit: Payer: Self-pay | Admitting: Family Medicine

## 2016-11-07 ENCOUNTER — Other Ambulatory Visit: Payer: Self-pay | Admitting: Family Medicine

## 2017-02-15 ENCOUNTER — Other Ambulatory Visit: Payer: Self-pay | Admitting: Family Medicine

## 2017-05-05 ENCOUNTER — Other Ambulatory Visit: Payer: Self-pay | Admitting: Family Medicine

## 2017-05-22 ENCOUNTER — Other Ambulatory Visit: Payer: Self-pay | Admitting: Family Medicine

## 2017-05-22 NOTE — Telephone Encounter (Signed)
Medication refill for one time only.  Patient needs to be seen.  Letter sent for patient to call and schedule 

## 2017-06-29 ENCOUNTER — Encounter: Payer: Self-pay | Admitting: Family Medicine

## 2017-06-29 ENCOUNTER — Other Ambulatory Visit: Payer: Self-pay | Admitting: Family Medicine

## 2017-07-06 ENCOUNTER — Other Ambulatory Visit: Payer: Self-pay | Admitting: Family Medicine

## 2017-07-09 ENCOUNTER — Ambulatory Visit: Payer: Medicare PPO | Admitting: Family Medicine

## 2017-07-09 ENCOUNTER — Other Ambulatory Visit: Payer: Self-pay

## 2017-07-09 ENCOUNTER — Encounter: Payer: Self-pay | Admitting: Family Medicine

## 2017-07-09 VITALS — BP 142/78 | HR 80 | Temp 98.2°F | Resp 16 | Ht 72.0 in | Wt 211.0 lb

## 2017-07-09 DIAGNOSIS — Z23 Encounter for immunization: Secondary | ICD-10-CM

## 2017-07-09 DIAGNOSIS — M545 Low back pain, unspecified: Secondary | ICD-10-CM

## 2017-07-09 DIAGNOSIS — I10 Essential (primary) hypertension: Secondary | ICD-10-CM | POA: Diagnosis not present

## 2017-07-09 DIAGNOSIS — E038 Other specified hypothyroidism: Secondary | ICD-10-CM

## 2017-07-09 DIAGNOSIS — E119 Type 2 diabetes mellitus without complications: Secondary | ICD-10-CM | POA: Diagnosis not present

## 2017-07-09 NOTE — Progress Notes (Signed)
Subjective:    Patient ID: [name], male    DOB: 1946/05/22, 71 y.o.   MRN: [id number]  HPI Patient has not been seen in more than a year.  Past medical history is significant for type 2 diabetes mellitus without complication.  He denies polyuria polydipsia or blurry vision.  He is currently taking metformin.  He is not checking his blood sugar.  He is overdue for urine microalbumin along with a hemoglobin A1c.  He also has a history of hypertension.  He denies any chest pain shortness of breath or dyspnea on exertion.  Blood pressure today is borderline.  He is not checking it at home.  He also has a history of hypothyroidism.  He is currently on levothyroxine 150 mcg a day.  He denies any fatigue, palpitations, tachycardia, weight loss, weight gain.  He is due for a TSH.  He is due for a diabetic foot exam.  He is also due for a flu shot.  He also complains of pain in the lower left side of his back.  It comes and goes.  It tends to be worse after he is sedentary for a long period of time.  When he stands up he feels extremely stiff.  He denies any numbness or tingling in his legs or weakness in his legs.  Diclofenac 75 mg p.o. twice daily is not effective Past Medical History:  Diagnosis Date  . Diabetes mellitus without complication (HCC)   . Hyperlipidemia   . Hypertension   . Kidney stones   . Thyroid disease    hypothyroidism   Past Surgical History:  Procedure Laterality Date  . APPENDECTOMY    . HEMORRHOID SURGERY     Current Outpatient Medications on File Prior to Visit  Medication Sig Dispense Refill  . allopurinol (ZYLOPRIM) 100 MG tablet TAKE 2 TABLETS (200 MG TOTAL) BY MOUTH DAILY. 180 tablet 0  . amLODipine (NORVASC) 5 MG tablet TAKE 1 TABLET (5 MG TOTAL) BY MOUTH DAILY. 90 tablet 3  . aspirin EC 81 MG tablet Take 81 mg by mouth daily.    Marland Kitchen. atorvastatin (LIPITOR) 40 MG tablet TAKE 1 TABLET BY MOUTH EVERY DAY. *PATIENT NEEDS APPT FOR FURTHER FILLS* 30 tablet 0  .  benazepril (LOTENSIN) 40 MG tablet TAKE 1 TABLET (40 MG TOTAL) BY MOUTH DAILY. 90 tablet 3  . diclofenac (VOLTAREN) 75 MG EC tablet TAKE 1 TABLET (75 MG TOTAL) BY MOUTH 2 (TWO) TIMES DAILY. 30 tablet 11  . levothyroxine (SYNTHROID, LEVOTHROID) 150 MCG tablet TAKE 1 TABLET (150 MCG TOTAL) BY MOUTH DAILY. 90 tablet 2  . metFORMIN (GLUCOPHAGE) 500 MG tablet TAKE 1 TABLET BY MOUTH TWICE A DAY WITH A MEAL 180 tablet 1  . VIAGRA 100 MG tablet TAKE 1/2 TO 1 TABLET BY MOUTH EVERY DAY AS NEEDED FOR ERECTILE DYSFUNCTION 5 tablet 0   No current facility-administered medications on file prior to visit.    No Known Allergies Social History   Socioeconomic History  . Marital status: Married    Spouse name: Not on file  . Number of children: Not on file  . Years of education: Not on file  . Highest education level: Not on file  Social Needs  . Financial resource strain: Not on file  . Food insecurity - worry: Not on file  . Food insecurity - inability: Not on file  . Transportation needs - medical: Not on file  . Transportation needs - non-medical: Not on file  Occupational History  . Not on file  Tobacco Use  . Smoking status: Former Games developermoker  . Smokeless tobacco: Former Engineer, waterUser  Substance and Sexual Activity  . Alcohol use: No  . Drug use: No  . Sexual activity: Not on file    Comment: separated from wife,   Other Topics Concern  . Not on file  Social History Narrative  . Not on file     Review of Systems  All other systems reviewed and are negative.      Objective:   Physical Exam  Constitutional: He is oriented to person, place, and time. He appears well-developed and well-nourished.  Cardiovascular: Normal rate, regular rhythm, normal heart sounds and intact distal pulses.  No murmur heard. Pulmonary/Chest: Effort normal and breath sounds normal. No respiratory distress. He has no wheezes. He has no rales.  Abdominal: Soft. Bowel sounds are normal. He exhibits no distension. There  is no tenderness. There is no rebound and no guarding.  Musculoskeletal: He exhibits no edema.       Lumbar back: He exhibits decreased range of motion and pain. He exhibits no tenderness, no bony tenderness, no swelling, no edema, no deformity and no spasm.       Back:  Neurological: He is alert and oriented to person, place, and time. He has normal reflexes. No cranial nerve deficit. He exhibits normal muscle tone. Coordination normal.  Vitals reviewed.         Assessment & Plan:  Acute left-sided low back pain without sciatica - Plan: DG Lumbar Spine Complete  Diabetes mellitus without complication (HCC) - Plan: CBC with Differential/Platelet, COMPLETE METABOLIC PANEL WITH GFR, Microalbumin, urine, Hemoglobin A1c, LDL Cholesterol, Direct  Other specified hypothyroidism - Plan: TSH  Needs flu shot - Plan: Flu vaccine HIGH DOSE PF (Fluzone High dose)  Essential hypertension  Regarding his back pain, it sounds muscular.  I would like to obtain x-rays of the lower lumbar spine to rule out other significant pathology like degenerative disc disease.  If x-rays are normal, I would recommend physical therapy and switching diclofenac to Celebrex.  Regarding his diabetes, I would like to check a hemoglobin A1c.  I would also check a urine microalbumin.  I recommended an annual diabetic eye exam.  Diabetic foot exam is performed today and is normal.  He received his flu shot.  His blood pressure is borderline.  I recommended that he check it every day and if elevated greater than 140/90, we would increase amlodipine to 10 mg a day.  Regarding his hypothyroidism, I will check a TSH and titrate his levothyroxine to achieve a therapeutic range.  I will also check a direct LDL cholesterol because the patient is overdue for this and he seldom comes back for lab work fasting.  Goal less than 100.

## 2017-07-09 NOTE — Progress Notes (Signed)
fl

## 2017-07-10 LAB — CBC WITH DIFFERENTIAL/PLATELET
BASOS PCT: 0.9 %
Basophils Absolute: 50 cells/uL (ref 0–200)
EOS PCT: 5.7 %
Eosinophils Absolute: 314 cells/uL (ref 15–500)
HCT: 40.4 % (ref 38.5–50.0)
HEMOGLOBIN: 14 g/dL (ref 13.2–17.1)
Lymphs Abs: 1601 cells/uL (ref 850–3900)
MCH: 30 pg (ref 27.0–33.0)
MCHC: 34.7 g/dL (ref 32.0–36.0)
MCV: 86.5 fL (ref 80.0–100.0)
MONOS PCT: 11.3 %
MPV: 12.2 fL (ref 7.5–12.5)
NEUTROS ABS: 2915 {cells}/uL (ref 1500–7800)
Neutrophils Relative %: 53 %
Platelets: 160 10*3/uL (ref 140–400)
RBC: 4.67 10*6/uL (ref 4.20–5.80)
RDW: 12.7 % (ref 11.0–15.0)
Total Lymphocyte: 29.1 %
WBC mixed population: 622 cells/uL (ref 200–950)
WBC: 5.5 10*3/uL (ref 3.8–10.8)

## 2017-07-10 LAB — COMPLETE METABOLIC PANEL WITH GFR
AG RATIO: 1.7 (calc) (ref 1.0–2.5)
ALBUMIN MSPROF: 4.7 g/dL (ref 3.6–5.1)
ALKALINE PHOSPHATASE (APISO): 105 U/L (ref 40–115)
ALT: 21 U/L (ref 9–46)
AST: 19 U/L (ref 10–35)
BILIRUBIN TOTAL: 0.7 mg/dL (ref 0.2–1.2)
BUN / CREAT RATIO: 20 (calc) (ref 6–22)
BUN: 26 mg/dL — AB (ref 7–25)
CALCIUM: 9.6 mg/dL (ref 8.6–10.3)
CHLORIDE: 103 mmol/L (ref 98–110)
CO2: 26 mmol/L (ref 20–32)
Creat: 1.3 mg/dL — ABNORMAL HIGH (ref 0.70–1.18)
GFR, Est African American: 63 mL/min/{1.73_m2} (ref >=60)
GFR, Est Non African American: 54 mL/min/{1.73_m2} — ABNORMAL LOW (ref >=60)
GLUCOSE: 119 mg/dL — AB (ref 65–99)
Globulin: 2.7 g/dL (calc) (ref 1.9–3.7)
POTASSIUM: 4.3 mmol/L (ref 3.5–5.3)
Sodium: 139 mmol/L (ref 135–146)
TOTAL PROTEIN: 7.4 g/dL (ref 6.1–8.1)

## 2017-07-10 LAB — TSH: TSH: 0.23 mIU/L — ABNORMAL LOW (ref 0.40–4.50)

## 2017-07-10 LAB — HEMOGLOBIN A1C
HEMOGLOBIN A1C: 7 %{Hb} — AB (ref <5.7)
MEAN PLASMA GLUCOSE: 154 (calc)
eAG (mmol/L): 8.5 (calc)

## 2017-07-10 LAB — LDL CHOLESTEROL, DIRECT: LDL DIRECT: 80 mg/dL (ref <100)

## 2017-07-10 LAB — MICROALBUMIN, URINE: MICROALB UR: 1 mg/dL

## 2017-07-13 ENCOUNTER — Other Ambulatory Visit: Payer: Self-pay | Admitting: Family Medicine

## 2017-07-13 DIAGNOSIS — E039 Hypothyroidism, unspecified: Secondary | ICD-10-CM

## 2017-07-13 MED ORDER — LEVOTHYROXINE SODIUM 137 MCG PO TABS: 137.0000 ug | ORAL_TABLET | Freq: Every day | ORAL | 3 refills | Status: DC

## 2017-08-03 ENCOUNTER — Other Ambulatory Visit: Payer: Self-pay | Admitting: Family Medicine

## 2017-08-18 ENCOUNTER — Other Ambulatory Visit: Payer: Self-pay | Admitting: Family Medicine

## 2017-09-02 ENCOUNTER — Other Ambulatory Visit: Payer: Self-pay | Admitting: Family Medicine

## 2017-09-10 ENCOUNTER — Other Ambulatory Visit: Payer: Self-pay | Admitting: Family Medicine

## 2017-11-05 ENCOUNTER — Ambulatory Visit: Payer: Medicare PPO | Admitting: Family Medicine

## 2017-11-05 ENCOUNTER — Encounter: Payer: Self-pay | Admitting: Family Medicine

## 2017-11-05 VITALS — BP 154/80 | HR 76 | Temp 98.0°F | Resp 14 | Ht 72.0 in | Wt 206.0 lb

## 2017-11-05 DIAGNOSIS — J069 Acute upper respiratory infection, unspecified: Secondary | ICD-10-CM

## 2017-11-05 MED ORDER — BENZONATATE 100 MG PO CAPS: 200.0000 mg | ORAL_CAPSULE | Freq: Three times a day (TID) | ORAL | 0 refills | Status: DC | PRN

## 2017-11-05 NOTE — Progress Notes (Signed)
Subjective:    Patient ID: Caprice Kluver, male    DOB: 1946/03/11, 72 y.o.   MRN: [id number]  HPI Patient presents with a one-week history of cough and chest congestion. He denies any shortness of breath. He denies any purulent sputum. He denies any fever. He does report rattling in his chest at night. He denies any otalgia. He denies any sore throat. He denies any sinus pain. Past Medical History:  Diagnosis Date  . Diabetes mellitus without complication (HCC)   . Hyperlipidemia   . Hypertension   . Kidney stones   . Thyroid disease    hypothyroidism   Past Surgical History:  Procedure Laterality Date  . APPENDECTOMY    . HEMORRHOID SURGERY     Current Outpatient Medications on File Prior to Visit  Medication Sig Dispense Refill  . allopurinol (ZYLOPRIM) 100 MG tablet TAKE 2 TABLETS (200 MG TOTAL) BY MOUTH DAILY. 180 tablet 0  . amLODipine (NORVASC) 5 MG tablet TAKE 1 TABLET (5 MG TOTAL) BY MOUTH DAILY. 90 tablet 3  . aspirin EC 81 MG tablet Take 81 mg by mouth daily.    Marland Kitchen atorvastatin (LIPITOR) 40 MG tablet Take 1 tablet (40 mg total) by mouth daily. 90 tablet 1  . benazepril (LOTENSIN) 40 MG tablet TAKE 1 TABLET (40 MG TOTAL) BY MOUTH DAILY. 90 tablet 3  . diclofenac (VOLTAREN) 75 MG EC tablet TAKE 1 TABLET (75 MG TOTAL) BY MOUTH 2 (TWO) TIMES DAILY. 30 tablet 11  . levothyroxine (SYNTHROID, LEVOTHROID) 137 MCG tablet Take 1 tablet (137 mcg total) daily before breakfast by mouth. 30 tablet 3  . levothyroxine (SYNTHROID, LEVOTHROID) 150 MCG tablet TAKE 1 TABLET (150 MCG TOTAL) BY MOUTH DAILY. 90 tablet 2  . metFORMIN (GLUCOPHAGE) 500 MG tablet TAKE 1 TABLET BY MOUTH TWICE A DAY WITH A MEAL 180 tablet 1  . VIAGRA 100 MG tablet TAKE 1/2 TO 1 TABLET BY MOUTH EVERY DAY AS NEEDED FOR ERECTILE DYSFUNCTION 5 tablet 0   No current facility-administered medications on file prior to visit.    No Known Allergies Social History   Socioeconomic History  . Marital status: Married   Spouse name: Not on file  . Number of children: Not on file  . Years of education: Not on file  . Highest education level: Not on file  Social Needs  . Financial resource strain: Not on file  . Food insecurity - worry: Not on file  . Food insecurity - inability: Not on file  . Transportation needs - medical: Not on file  . Transportation needs - non-medical: Not on file  Occupational History  . Not on file  Tobacco Use  . Smoking status: Former Games developer  . Smokeless tobacco: Former Engineer, water and Sexual Activity  . Alcohol use: No  . Drug use: No  . Sexual activity: Not on file    Comment: separated from wife,   Other Topics Concern  . Not on file  Social History Narrative  . Not on file      Review of Systems  All other systems reviewed and are negative.      Objective:   Physical Exam  Constitutional: He appears well-developed and well-nourished. No distress.  HENT:  Right Ear: External ear normal.  Left Ear: External ear normal.  Nose: Nose normal.  Mouth/Throat: Oropharynx is clear and moist. No oropharyngeal exudate.  Eyes: Conjunctivae are normal.  Neck: Neck supple.  Cardiovascular: Normal rate, regular rhythm and normal  heart sounds. Exam reveals no gallop.  No murmur heard. Pulmonary/Chest: Effort normal and breath sounds normal. No respiratory distress. He has no wheezes. He has no rales.  Lymphadenopathy:    He has no cervical adenopathy.  Skin: He is not diaphoretic.  Vitals reviewed.         Assessment & Plan:  URI, acute  Patient has a viral upper respiratory infection. Symptoms are gradually improving. He can use Mucinex DM over-the-counter for chest congestion and cough. Recommended tincture of time. He can use Tessalon Perles 200 mg every 8 hours as needed for severe cough. Blood pressures elevated. I've recommended that the patient check his blood pressure more frequently at home and notify me of the values in one week to determine if  more medication is necessary. He attributes this to a salty meal he ate earlier today

## 2017-11-13 ENCOUNTER — Telehealth: Payer: Self-pay | Admitting: Family Medicine

## 2017-11-13 NOTE — Telephone Encounter (Signed)
Calling with BP readings 130/75 average has been over 140 a few times but not consistently.

## 2017-11-13 NOTE — Telephone Encounter (Signed)
Those are good enough.  No change.

## 2017-11-13 NOTE — Telephone Encounter (Signed)
Pt aware.

## 2017-11-29 ENCOUNTER — Other Ambulatory Visit: Payer: Self-pay | Admitting: Family Medicine

## 2018-03-06 ENCOUNTER — Other Ambulatory Visit: Payer: Self-pay | Admitting: Family Medicine

## 2018-03-29 ENCOUNTER — Other Ambulatory Visit: Payer: Self-pay | Admitting: Family Medicine

## 2018-06-16 ENCOUNTER — Other Ambulatory Visit: Payer: Self-pay | Admitting: Family Medicine

## 2018-07-05 ENCOUNTER — Ambulatory Visit: Payer: Medicare PPO

## 2018-07-11 ENCOUNTER — Other Ambulatory Visit: Payer: Self-pay | Admitting: Family Medicine

## 2018-07-13 ENCOUNTER — Encounter: Payer: Self-pay | Admitting: Family Medicine

## 2018-07-13 ENCOUNTER — Ambulatory Visit (INDEPENDENT_AMBULATORY_CARE_PROVIDER_SITE_OTHER): Payer: Medicare PPO | Admitting: Family Medicine

## 2018-07-13 VITALS — BP 141/74 | HR 68 | Temp 97.9°F | Resp 16 | Ht 72.0 in | Wt 209.0 lb

## 2018-07-13 DIAGNOSIS — E039 Hypothyroidism, unspecified: Secondary | ICD-10-CM

## 2018-07-13 DIAGNOSIS — M25511 Pain in right shoulder: Secondary | ICD-10-CM

## 2018-07-13 DIAGNOSIS — E119 Type 2 diabetes mellitus without complications: Secondary | ICD-10-CM | POA: Diagnosis not present

## 2018-07-13 DIAGNOSIS — I10 Essential (primary) hypertension: Secondary | ICD-10-CM | POA: Diagnosis not present

## 2018-07-13 DIAGNOSIS — Z23 Encounter for immunization: Secondary | ICD-10-CM

## 2018-07-13 MED ORDER — MECLIZINE HCL 25 MG PO TABS: 25.0000 mg | ORAL_TABLET | Freq: Three times a day (TID) | ORAL | 0 refills | Status: DC | PRN

## 2018-07-13 NOTE — Progress Notes (Signed)
Subjective:    Patient ID: [name], male    DOB: 06/18/1946, 72 y.o.   MRN: [id number]  HPI Patient is a very pleasant 72 year old Caucasian male who presents today complaining of right shoulder pain.  The pain has been gradual in onset over the last month.  He reports pain with abduction greater than 90 degrees.  He has a positive empty can sign.  He also has a positive Hawkins sign.  He has a negative Spurling sign.  He has good strength however resisted abduction creates pain.  He has no pain with internal or external rotation.  He denies any falls or significant injury.  Symptoms are consistent with subacromial bursitis/impingement syndrome versus supraspinatus tendinitis.  However while the patient was here I discussed with him that we have not done lab work to monitor his diabetes in quite some time.  He has not had labs checked since November of last year.  He denies any polyuria, polydipsia, or blurry vision.  He denies any chest pain or shortness of breath.  He has not checking his sugars regularly.  He is due for his flu shot today.  He is also due to recheck a TSH to monitor his thyroid.  Unfortunately he has eaten something today and therefore I cannot check his cholesterol.  However getting him to return for lab work is been difficult in the past and therefore I would like to get what I can while he is here.  His blood pressure is acceptable for his age.  He also is requesting something he can use sparingly when he gets vertigo.  He states that occasionally, he will develop mild vertigo that causes nausea.  He describes it as the sensation of his environment moving around him, feeling lightheaded, and feeling nauseated.  This occurs randomly and very sporadically.  It is usually associated with movement.  Meclizine in the past with success Past Medical History:  Diagnosis Date  . Diabetes mellitus without complication (HCC)   . Hyperlipidemia   . Hypertension   . Kidney stones   .  Thyroid disease    hypothyroidism   Past Surgical History:  Procedure Laterality Date  . APPENDECTOMY    . HEMORRHOID SURGERY     Current Outpatient Medications on File Prior to Visit  Medication Sig Dispense Refill  . allopurinol (ZYLOPRIM) 100 MG tablet TAKE 2 TABLETS (200 MG TOTAL) BY MOUTH DAILY. 180 tablet 0  . amLODipine (NORVASC) 5 MG tablet TAKE 1 TABLET (5 MG TOTAL) BY MOUTH DAILY. 90 tablet 3  . aspirin EC 81 MG tablet Take 81 mg by mouth daily.    Marland Kitchen atorvastatin (LIPITOR) 40 MG tablet TAKE 1 TABLET BY MOUTH EVERY DAY 90 tablet 1  . benazepril (LOTENSIN) 40 MG tablet TAKE 1 TABLET (40 MG TOTAL) BY MOUTH DAILY. 90 tablet 3  . diclofenac (VOLTAREN) 75 MG EC tablet TAKE 1 TABLET (75 MG TOTAL) BY MOUTH 2 (TWO) TIMES DAILY. 30 tablet 11  . levothyroxine (SYNTHROID, LEVOTHROID) 137 MCG tablet Take 1 tablet (137 mcg total) daily before breakfast by mouth. 30 tablet 3  . metFORMIN (GLUCOPHAGE) 500 MG tablet TAKE 1 TABLET BY MOUTH TWICE A DAY WITH A MEAL 180 tablet 1  . VIAGRA 100 MG tablet TAKE 1/2 TO 1 TABLET BY MOUTH EVERY DAY AS NEEDED FOR ERECTILE DYSFUNCTION 5 tablet 0   No current facility-administered medications on file prior to visit.    No Known Allergies Social History   Socioeconomic  History  . Marital status: Married    Spouse name: Not on file  . Number of children: Not on file  . Years of education: Not on file  . Highest education level: Not on file  Occupational History  . Not on file  Social Needs  . Financial resource strain: Not on file  . Food insecurity:    Worry: Not on file    Inability: Not on file  . Transportation needs:    Medical: Not on file    Non-medical: Not on file  Tobacco Use  . Smoking status: Former Games developermoker  . Smokeless tobacco: Former Engineer, waterUser  Substance and Sexual Activity  . Alcohol use: No  . Drug use: No  . Sexual activity: Not on file    Comment: separated from wife,   Lifestyle  . Physical activity:    Days per week: Not on  file    Minutes per session: Not on file  . Stress: Not on file  Relationships  . Social connections:    Talks on phone: Not on file    Gets together: Not on file    Attends religious service: Not on file    Active member of club or organization: Not on file    Attends meetings of clubs or organizations: Not on file    Relationship status: Not on file  . Intimate partner violence:    Fear of current or ex partner: Not on file    Emotionally abused: Not on file    Physically abused: Not on file    Forced sexual activity: Not on file  Other Topics Concern  . Not on file  Social History Narrative  . Not on file     Review of Systems  All other systems reviewed and are negative.      Objective:   Physical Exam  Constitutional: He is oriented to person, place, and time. He appears well-developed and well-nourished.  Cardiovascular: Normal rate, regular rhythm, normal heart sounds and intact distal pulses.  No murmur heard. Pulmonary/Chest: Effort normal and breath sounds normal. No respiratory distress. He has no wheezes. He has no rales.  Abdominal: Soft. Bowel sounds are normal. He exhibits no distension. There is no tenderness. There is no rebound and no guarding.  Musculoskeletal: He exhibits no edema.       Right shoulder: He exhibits decreased range of motion and pain. He exhibits no tenderness, no bony tenderness and normal strength.  Neurological: He is alert and oriented to person, place, and time. He has normal reflexes. No cranial nerve deficit. He exhibits normal muscle tone. Coordination normal.  Vitals reviewed.         Assessment & Plan:  Hypothyroidism, unspecified type - Plan: COMPLETE METABOLIC PANEL WITH GFR, TSH, Flu vaccine HIGH DOSE PF  Diabetes mellitus without complication (HCC) - Plan: Hemoglobin A1c, COMPLETE METABOLIC PANEL WITH GFR, TSH, Microalbumin, urine, Flu vaccine HIGH DOSE PF  Essential hypertension - Plan: Flu vaccine HIGH DOSE  PF  Needs flu shot - Plan: Flu vaccine HIGH DOSE PF  Acute pain of right shoulder Patient will treat the supraspinatus tendinitis with NSAIDs over-the-counter.  If not improving, he can return for cortisone injection into the subacromial space.  He received his flu shot today.  I will give him meclizine 25 mg every 8 hours as needed vertigo.  I cautioned the patient he should only use this sparingly.  If he is getting vertigo frequently I need to hear back from him  immediately.  While the patient is here today, I would like to check lab work.  I will check a TSH to monitor treatment of his hypothyroidism.  I will check a hemoglobin A1c to monitor management of his diabetes.  I will also check a urine microalbumin and a CMP.  I encouraged the patient to return fasting so I can check his cholesterol.

## 2018-07-14 LAB — COMPLETE METABOLIC PANEL WITH GFR
AG Ratio: 1.6 (calc) (ref 1.0–2.5)
ALKALINE PHOSPHATASE (APISO): 113 U/L (ref 40–115)
ALT: 16 U/L (ref 9–46)
AST: 16 U/L (ref 10–35)
Albumin: 4.6 g/dL (ref 3.6–5.1)
BILIRUBIN TOTAL: 0.6 mg/dL (ref 0.2–1.2)
BUN / CREAT RATIO: 19 (calc) (ref 6–22)
BUN: 23 mg/dL (ref 7–25)
CHLORIDE: 104 mmol/L (ref 98–110)
CO2: 26 mmol/L (ref 20–32)
CREATININE: 1.19 mg/dL — AB (ref 0.70–1.18)
Calcium: 9.9 mg/dL (ref 8.6–10.3)
GFR, Est African American: 69 mL/min/{1.73_m2} (ref >=60)
GFR, Est Non African American: 60 mL/min/{1.73_m2} (ref >=60)
GLUCOSE: 171 mg/dL — AB (ref 65–99)
Globulin: 2.8 g/dL (calc) (ref 1.9–3.7)
Potassium: 4.6 mmol/L (ref 3.5–5.3)
Sodium: 140 mmol/L (ref 135–146)
Total Protein: 7.4 g/dL (ref 6.1–8.1)

## 2018-07-14 LAB — HEMOGLOBIN A1C
EAG (MMOL/L): 10 (calc)
Hgb A1c MFr Bld: 7.9 % of total Hgb — ABNORMAL HIGH (ref <5.7)
Mean Plasma Glucose: 180 (calc)

## 2018-07-14 LAB — TSH: TSH: 0.8 mIU/L (ref 0.40–4.50)

## 2018-07-14 LAB — MICROALBUMIN, URINE: Microalb, Ur: 0.8 mg/dL

## 2018-07-22 ENCOUNTER — Other Ambulatory Visit: Payer: Self-pay | Admitting: Family Medicine

## 2018-07-22 DIAGNOSIS — E119 Type 2 diabetes mellitus without complications: Secondary | ICD-10-CM

## 2018-07-22 DIAGNOSIS — Z79899 Other long term (current) drug therapy: Secondary | ICD-10-CM

## 2018-07-22 DIAGNOSIS — I10 Essential (primary) hypertension: Secondary | ICD-10-CM

## 2018-07-22 MED ORDER — METFORMIN HCL 1000 MG PO TABS: 1000.0000 mg | ORAL_TABLET | Freq: Two times a day (BID) | ORAL | 1 refills | Status: DC

## 2018-07-22 MED ORDER — SITAGLIPTIN PHOSPHATE 100 MG PO TABS: 100.0000 mg | ORAL_TABLET | Freq: Every day | ORAL | 1 refills | Status: DC

## 2018-10-29 ENCOUNTER — Other Ambulatory Visit: Payer: Medicare PPO

## 2018-10-29 ENCOUNTER — Other Ambulatory Visit: Payer: Self-pay | Admitting: Family Medicine

## 2018-12-06 ENCOUNTER — Other Ambulatory Visit: Payer: Self-pay | Admitting: Family Medicine

## 2018-12-06 MED ORDER — AMLODIPINE BESYLATE 5 MG PO TABS: ORAL_TABLET | ORAL | 3 refills | Status: DC

## 2019-01-20 ENCOUNTER — Other Ambulatory Visit: Payer: Self-pay | Admitting: Family Medicine

## 2019-05-08 ENCOUNTER — Other Ambulatory Visit: Payer: Self-pay | Admitting: Family Medicine

## 2019-05-23 ENCOUNTER — Other Ambulatory Visit: Payer: Self-pay | Admitting: Family Medicine

## 2019-05-30 ENCOUNTER — Other Ambulatory Visit: Payer: Self-pay | Admitting: Family Medicine

## 2019-06-06 ENCOUNTER — Other Ambulatory Visit: Payer: Self-pay | Admitting: Family Medicine

## 2019-06-28 ENCOUNTER — Other Ambulatory Visit: Payer: Self-pay | Admitting: Family Medicine

## 2019-07-02 ENCOUNTER — Other Ambulatory Visit: Payer: Self-pay | Admitting: Family Medicine

## 2019-08-16 ENCOUNTER — Other Ambulatory Visit: Payer: Self-pay | Admitting: Family Medicine

## 2019-09-05 ENCOUNTER — Other Ambulatory Visit: Payer: Self-pay | Admitting: Family Medicine

## 2019-09-08 ENCOUNTER — Other Ambulatory Visit: Payer: Self-pay | Admitting: Family Medicine

## 2019-09-23 ENCOUNTER — Other Ambulatory Visit: Payer: Medicare PPO

## 2019-09-23 ENCOUNTER — Other Ambulatory Visit: Payer: Self-pay

## 2019-09-23 ENCOUNTER — Other Ambulatory Visit: Payer: Self-pay | Admitting: Family Medicine

## 2019-09-23 DIAGNOSIS — E119 Type 2 diabetes mellitus without complications: Secondary | ICD-10-CM

## 2019-09-23 DIAGNOSIS — E039 Hypothyroidism, unspecified: Secondary | ICD-10-CM

## 2019-09-23 DIAGNOSIS — I10 Essential (primary) hypertension: Secondary | ICD-10-CM | POA: Diagnosis not present

## 2019-09-24 LAB — COMPREHENSIVE METABOLIC PANEL
AG Ratio: 1.5 (calc) (ref 1.0–2.5)
ALT: 16 U/L (ref 9–46)
AST: 17 U/L (ref 10–35)
Albumin: 4.5 g/dL (ref 3.6–5.1)
Alkaline phosphatase (APISO): 101 U/L (ref 35–144)
BUN/Creatinine Ratio: 14 (calc) (ref 6–22)
BUN: 17 mg/dL (ref 7–25)
CO2: 28 mmol/L (ref 20–32)
Calcium: 10 mg/dL (ref 8.6–10.3)
Chloride: 105 mmol/L (ref 98–110)
Creat: 1.24 mg/dL — ABNORMAL HIGH (ref 0.70–1.18)
Globulin: 3 g/dL (calc) (ref 1.9–3.7)
Glucose, Bld: 85 mg/dL (ref 65–99)
Potassium: 4.3 mmol/L (ref 3.5–5.3)
Sodium: 140 mmol/L (ref 135–146)
Total Bilirubin: 0.9 mg/dL (ref 0.2–1.2)
Total Protein: 7.5 g/dL (ref 6.1–8.1)

## 2019-09-24 LAB — LIPID PANEL
Cholesterol: 137 mg/dL (ref <200)
HDL: 30 mg/dL — ABNORMAL LOW (ref >=40)
LDL Cholesterol (Calc): 74 mg/dL (calc)
Non-HDL Cholesterol (Calc): 107 mg/dL (calc) (ref <130)
Total CHOL/HDL Ratio: 4.6 (calc) (ref <5.0)
Triglycerides: 248 mg/dL — ABNORMAL HIGH (ref <150)

## 2019-09-24 LAB — MICROALBUMIN / CREATININE URINE RATIO
Creatinine, Urine: 167 mg/dL (ref 20–320)
Microalb Creat Ratio: 6 mcg/mg creat (ref <30)
Microalb, Ur: 1 mg/dL

## 2019-09-24 LAB — CBC WITH DIFFERENTIAL/PLATELET
Absolute Monocytes: 701 cells/uL (ref 200–950)
Basophils Absolute: 37 cells/uL (ref 0–200)
Basophils Relative: 0.6 %
Eosinophils Absolute: 273 cells/uL (ref 15–500)
Eosinophils Relative: 4.4 %
HCT: 42.7 % (ref 38.5–50.0)
Hemoglobin: 14.3 g/dL (ref 13.2–17.1)
Lymphs Abs: 1916 cells/uL (ref 850–3900)
MCH: 29 pg (ref 27.0–33.0)
MCHC: 33.5 g/dL (ref 32.0–36.0)
MCV: 86.6 fL (ref 80.0–100.0)
MPV: 11.7 fL (ref 7.5–12.5)
Monocytes Relative: 11.3 %
Neutro Abs: 3274 cells/uL (ref 1500–7800)
Neutrophils Relative %: 52.8 %
Platelets: 167 10*3/uL (ref 140–400)
RBC: 4.93 10*6/uL (ref 4.20–5.80)
RDW: 12.7 % (ref 11.0–15.0)
Total Lymphocyte: 30.9 %
WBC: 6.2 10*3/uL (ref 3.8–10.8)

## 2019-09-24 LAB — TSH: TSH: 0.05 mIU/L — ABNORMAL LOW (ref 0.40–4.50)

## 2019-09-24 LAB — HEMOGLOBIN A1C
Hgb A1c MFr Bld: 7.4 % of total Hgb — ABNORMAL HIGH (ref <5.7)
Mean Plasma Glucose: 166 (calc)
eAG (mmol/L): 9.2 (calc)

## 2019-09-27 ENCOUNTER — Ambulatory Visit (INDEPENDENT_AMBULATORY_CARE_PROVIDER_SITE_OTHER): Payer: Medicare PPO | Admitting: Family Medicine

## 2019-09-27 ENCOUNTER — Encounter: Payer: Self-pay | Admitting: Family Medicine

## 2019-09-27 ENCOUNTER — Other Ambulatory Visit: Payer: Self-pay

## 2019-09-27 VITALS — BP 140/80 | HR 74 | Temp 96.9°F | Resp 18 | Ht 72.0 in | Wt 206.0 lb

## 2019-09-27 DIAGNOSIS — E039 Hypothyroidism, unspecified: Secondary | ICD-10-CM

## 2019-09-27 DIAGNOSIS — E119 Type 2 diabetes mellitus without complications: Secondary | ICD-10-CM | POA: Diagnosis not present

## 2019-09-27 DIAGNOSIS — I10 Essential (primary) hypertension: Secondary | ICD-10-CM

## 2019-09-27 MED ORDER — LEVOTHYROXINE SODIUM 137 MCG PO CAPS: 1.0000 | ORAL_CAPSULE | Freq: Every day | ORAL | 3 refills | Status: DC

## 2019-09-27 NOTE — Progress Notes (Signed)
Subjective:    Patient ID: [name], male    DOB: 29-Jan-1946, 74 y.o.   MRN: [id number]  HPI Patient is a 74 year old Caucasian male here today for a follow-up of his chronic medical conditions.  He has a history of type 2 diabetes mellitus but he is currently only on Metformin 1000 mg twice daily.  He never started taking Januvia that I prescribed for him.  On his most recent lab work his hemoglobin A1c is 7.4.  He denies any polyuria, polydipsia, or blurry vision.  He denies any neuropathy in his feet.  His blood pressure today is adequately controlled at 140/80.  He denies any chest pain shortness of breath or dyspnea on exertion.  He also has a history of hypothyroidism for which she takes levothyroxine 150 mcg daily.  On his most recent lab work his TSH was almost 0 at 0.05 indicating that he is taking excessive amounts of levothyroxine.  He denies any tachycardia.  He denies any weight loss.  He denies any anxiety.  Depression screen and fall risk assessment are completed today and are normal.  Diabetic foot exam is normal.  Patient declines flu shot today.  He also declines hepatitis C screening.  Diabetic foot exam was performed today and is normal. Appointment on 09/23/2019  Component Date Value Ref Range Status  . Creatinine, Urine 09/23/2019 167  20 - 320 mg/dL Final  . Microalb, Ur 50/35/4656 1.0  mg/dL Final   Comment: Reference Range Not established   . Microalb Creat Ratio 09/23/2019 6  <30 mcg/mg creat Final   Comment: . The ADA defines abnormalities in albumin excretion as follows: Marland Kitchen Category         Result (mcg/mg creatinine) . Normal                    <30 Microalbuminuria         30-299  Clinical albuminuria   > OR = 300 . The ADA recommends that at least two of three specimens collected within a 3-6 month period be abnormal before considering a patient to be within a diagnostic category.   Marland Kitchen TSH 09/23/2019 0.05* 0.40 - 4.50 mIU/L Final  . WBC 09/23/2019  6.2  3.8 - 10.8 Thousand/uL Final  . RBC 09/23/2019 4.93  4.20 - 5.80 Million/uL Final  . Hemoglobin 09/23/2019 14.3  13.2 - 17.1 g/dL Final  . HCT 81/27/5170 42.7  38.5 - 50.0 % Final  . MCV 09/23/2019 86.6  80.0 - 100.0 fL Final  . MCH 09/23/2019 29.0  27.0 - 33.0 pg Final  . MCHC 09/23/2019 33.5  32.0 - 36.0 g/dL Final  . RDW 01/74/9449 12.7  11.0 - 15.0 % Final  . Platelets 09/23/2019 167  140 - 400 Thousand/uL Final  . MPV 09/23/2019 11.7  7.5 - 12.5 fL Final  . Neutro Abs 09/23/2019 3,274  1,500 - 7,800 cells/uL Final  . Lymphs Abs 09/23/2019 1,916  850 - 3,900 cells/uL Final  . Absolute Monocytes 09/23/2019 701  200 - 950 cells/uL Final  . Eosinophils Absolute 09/23/2019 273  15 - 500 cells/uL Final  . Basophils Absolute 09/23/2019 37  0 - 200 cells/uL Final  . Neutrophils Relative % 09/23/2019 52.8  % Final  . Total Lymphocyte 09/23/2019 30.9  % Final  . Monocytes Relative 09/23/2019 11.3  % Final  . Eosinophils Relative 09/23/2019 4.4  % Final  . Basophils Relative 09/23/2019 0.6  % Final  . Cholesterol 09/23/2019  137  <200 mg/dL Final  . HDL 03/55/9741 30* > OR = 40 mg/dL Final  . Triglycerides 09/23/2019 248* <150 mg/dL Final   Comment: . If a non-fasting specimen was collected, consider repeat triglyceride testing on a fasting specimen if clinically indicated.  Perry Mount et al. J. of Clin. Lipidol. 2015;9:129-169. .   . LDL Cholesterol (Calc) 09/23/2019 74  mg/dL (calc) Final   Comment: Reference range: <100 . Desirable range <100 mg/dL for primary prevention;   <70 mg/dL for patients with CHD or diabetic patients  with > or = 2 CHD risk factors. Marland Kitchen LDL-C is now calculated using the Martin-Hopkins  calculation, which is a validated novel method providing  better accuracy than the Friedewald equation in the  estimation of LDL-C.  Horald Pollen et al. Lenox Ahr. 6384;536(46): 2061-2068  (http://education.[facility].com/faq/FAQ164)   . Total CHOL/HDL Ratio 09/23/2019  4.6  <5.0 (calc) Final  . Non-HDL Cholesterol (Calc) 09/23/2019 107  <130 mg/dL (calc) Final   Comment: For patients with diabetes plus 1 major ASCVD risk  factor, treating to a non-HDL-C goal of <100 mg/dL  (LDL-C of <80 mg/dL) is considered a therapeutic  option.   . Hgb A1c MFr Bld 09/23/2019 7.4* <5.7 % of total Hgb Final   Comment: For someone without known diabetes, a hemoglobin A1c value of 6.5% or greater indicates that they may have  diabetes and this should be confirmed with a follow-up  test. . For someone with known diabetes, a value <7% indicates  that their diabetes is well controlled and a value  greater than or equal to 7% indicates suboptimal  control. A1c targets should be individualized based on  duration of diabetes, age, comorbid conditions, and  other considerations. . Currently, no consensus exists regarding use of hemoglobin A1c for diagnosis of diabetes for children. .   . Mean Plasma Glucose 09/23/2019 166  (calc) Final  . eAG (mmol/L) 09/23/2019 9.2  (calc) Final  . Glucose, Bld 09/23/2019 85  65 - 99 mg/dL Final   Comment: .            Fasting reference interval .   . BUN 09/23/2019 17  7 - 25 mg/dL Final  . Creat 32/08/2481 1.24* 0.70 - 1.18 mg/dL Final   Comment: For patients >67 years of age, the reference limit for Creatinine is approximately 13% higher for people identified as African-American. .   Edwena Felty Ratio 09/23/2019 14  6 - 22 (calc) Final  . Sodium 09/23/2019 140  135 - 146 mmol/L Final  . Potassium 09/23/2019 4.3  3.5 - 5.3 mmol/L Final  . Chloride 09/23/2019 105  98 - 110 mmol/L Final  . CO2 09/23/2019 28  20 - 32 mmol/L Final  . Calcium 09/23/2019 10.0  8.6 - 10.3 mg/dL Final  . Total Protein 09/23/2019 7.5  6.1 - 8.1 g/dL Final  . Albumin 50/10/7046 4.5  3.6 - 5.1 g/dL Final  . Globulin 88/91/6945 3.0  1.9 - 3.7 g/dL (calc) Final  . AG Ratio 09/23/2019 1.5  1.0 - 2.5 (calc) Final  . Total Bilirubin 09/23/2019 0.9   0.2 - 1.2 mg/dL Final  . Alkaline phosphatase (APISO) 09/23/2019 101  35 - 144 U/L Final  . AST 09/23/2019 17  10 - 35 U/L Final  . ALT 09/23/2019 16  9 - 46 U/L Final    Past Medical History:  Diagnosis Date  . Diabetes mellitus without complication (HCC)   . Hyperlipidemia   . Hypertension   .  Kidney stones   . Thyroid disease    hypothyroidism   Past Surgical History:  Procedure Laterality Date  . APPENDECTOMY    . HEMORRHOID SURGERY     Current Outpatient Medications on File Prior to Visit  Medication Sig Dispense Refill  . allopurinol (ZYLOPRIM) 100 MG tablet TAKE 2 TABLETS (200 MG TOTAL) BY MOUTH DAILY. 180 tablet 0  . amLODipine (NORVASC) 5 MG tablet TAKE 1 TABLET (5 MG TOTAL) BY MOUTH DAILY. 90 tablet 3  . aspirin EC 81 MG tablet Take 81 mg by mouth daily.    Marland Kitchen atorvastatin (LIPITOR) 40 MG tablet TAKE 1 TABLET BY MOUTH EVERY DAY 90 tablet 1  . benazepril (LOTENSIN) 40 MG tablet TAKE 1 TABLET (40 MG TOTAL) BY MOUTH DAILY. 90 tablet 3  . diclofenac (VOLTAREN) 75 MG EC tablet TAKE 1 TABLET (75 MG TOTAL) BY MOUTH 2 (TWO) TIMES DAILY. 30 tablet 11  . meclizine (ANTIVERT) 25 MG tablet Take 1 tablet (25 mg total) by mouth 3 (three) times daily as needed for dizziness. 30 tablet 0  . metFORMIN (GLUCOPHAGE) 1000 MG tablet TAKE 1 TABLET BY MOUTH 2 (TWO) TIMES DAILY WITH A MEAL. NEEDS OFFICE VISIT AND LABS 180 tablet 1  . VIAGRA 100 MG tablet TAKE 1/2 TO 1 TABLET BY MOUTH EVERY DAY AS NEEDED FOR ERECTILE DYSFUNCTION 5 tablet 0  . sitaGLIPtin (JANUVIA) 100 MG tablet Take 1 tablet (100 mg total) by mouth daily. (Patient not taking: Reported on 09/27/2019) 90 tablet 1   No current facility-administered medications on file prior to visit.   No Known Allergies Social History   Socioeconomic History  . Marital status: Married    Spouse name: Not on file  . Number of children: Not on file  . Years of education: Not on file  . Highest education level: Not on file  Occupational History   . Not on file  Tobacco Use  . Smoking status: Former Research scientist (life sciences)  . Smokeless tobacco: Former Network engineer and Sexual Activity  . Alcohol use: No  . Drug use: No  . Sexual activity: Not on file    Comment: separated from wife,   Other Topics Concern  . Not on file  Social History Narrative  . Not on file   Social Determinants of Health   Financial Resource Strain:   . Difficulty of Paying Living Expenses: Not on file  Food Insecurity:   . Worried About Charity fundraiser in the Last Year: Not on file  . Ran Out of Food in the Last Year: Not on file  Transportation Needs:   . Lack of Transportation (Medical): Not on file  . Lack of Transportation (Non-Medical): Not on file  Physical Activity:   . Days of Exercise per Week: Not on file  . Minutes of Exercise per Session: Not on file  Stress:   . Feeling of Stress : Not on file  Social Connections:   . Frequency of Communication with Friends and Family: Not on file  . Frequency of Social Gatherings with Friends and Family: Not on file  . Attends Religious Services: Not on file  . Active Member of Clubs or Organizations: Not on file  . Attends Archivist Meetings: Not on file  . Marital Status: Not on file  Intimate Partner Violence:   . Fear of Current or Ex-Partner: Not on file  . Emotionally Abused: Not on file  . Physically Abused: Not on file  . Sexually Abused:  Not on file     Review of Systems  All other systems reviewed and are negative.      Objective:   Physical Exam  Constitutional: He is oriented to person, place, and time. He appears well-developed and well-nourished. No distress.  HENT:  Mouth/Throat: Oropharynx is clear and moist.  Neck: No JVD present. No thyromegaly present.  Cardiovascular: Normal rate, regular rhythm, normal heart sounds and intact distal pulses.  No murmur heard. Pulmonary/Chest: Effort normal and breath sounds normal. No respiratory distress. He has no wheezes. He  has no rales.  Abdominal: Soft. Bowel sounds are normal. He exhibits no distension. There is no abdominal tenderness. There is no rebound and no guarding.  Musculoskeletal:        General: No edema.     Cervical back: Neck supple.  Lymphadenopathy:    He has no cervical adenopathy.  Neurological: He is alert and oriented to person, place, and time. He has normal reflexes. No cranial nerve deficit. He exhibits normal muscle tone. Coordination normal.  Skin: He is not diaphoretic.  Vitals reviewed.         Assessment & Plan:  Diabetes mellitus without complication (HCC)  Essential hypertension  Hypothyroidism, unspecified type  Start Januvia 100 mg a day and recheck hemoglobin A1c in 3 months.  Continue Metformin 1000 mg twice daily.  Blood pressure today is acceptable.  No changes in his antihypertensive therapy.  Reduce his levothyroxine to 137 mcg daily and recheck TSH in 8 weeks.  Patient declines hepatitis C screening today along with flu shot.  Diabetic foot exam, fall risk assessment, and depression screen are completed today and are normal.  Recommended diabetic eye exam.  Patient is overdue for prostate cancer screening as well as a colonoscopy however we can discuss this at a future visit.

## 2019-11-02 ENCOUNTER — Other Ambulatory Visit: Payer: Self-pay | Admitting: Family Medicine

## 2019-11-04 ENCOUNTER — Other Ambulatory Visit: Payer: Self-pay | Admitting: Family Medicine

## 2019-11-04 MED ORDER — DICLOFENAC SODIUM 75 MG PO TBEC: DELAYED_RELEASE_TABLET | ORAL | 11 refills | Status: DC

## 2019-12-12 ENCOUNTER — Other Ambulatory Visit: Payer: Self-pay | Admitting: Family Medicine

## 2020-01-27 ENCOUNTER — Ambulatory Visit: Payer: Medicare PPO | Admitting: Family Medicine

## 2020-01-31 ENCOUNTER — Ambulatory Visit: Payer: Medicare PPO | Admitting: Family Medicine

## 2020-01-31 ENCOUNTER — Other Ambulatory Visit: Payer: Self-pay

## 2020-01-31 VITALS — BP 140/74 | HR 78 | Temp 99.0°F | Ht 72.0 in | Wt 208.0 lb

## 2020-01-31 DIAGNOSIS — H8110 Benign paroxysmal vertigo, unspecified ear: Secondary | ICD-10-CM

## 2020-01-31 NOTE — Progress Notes (Signed)
Subjective:    Patient ID: [name], male    DOB: [birth year], 73 y.o.   MRN: [id number]  Patient is a very pleasant 74 year old Caucasian gentleman who presents today with vertigo.  Symptoms began last Tuesday.  He states that he woke up and stood up to get out of bed and the room began to span.  For the last week, the room spins whenever he turns his head in certain directions.  He will feel extremely nauseated like he is going to throw up.  Sometimes he will break out into a hot sweat.  He denies any chest pain or shortness of breath.  He denies any other neurologic deficits.  Specifically he denies any slurred speech, facial drooping, unilateral weakness or confusion.  Patient states that usually occurs when he is lying down or turns to the side while lying down.  If he holds his head still the symptoms do not happen. Past Medical History:  Diagnosis Date  . Diabetes mellitus without complication ([place])   . Hyperlipidemia   . Hypertension   . Kidney stones   . Thyroid disease    hypothyroidism   Past Surgical History:  Procedure Laterality Date  . APPENDECTOMY    . HEMORRHOID SURGERY     Current Outpatient Medications on File Prior to Visit  Medication Sig Dispense Refill  . allopurinol (ZYLOPRIM) 100 MG tablet TAKE 2 TABLETS (200 MG TOTAL) BY MOUTH DAILY. 180 tablet 0  . amLODipine (NORVASC) 5 MG tablet TAKE 1 TABLET (5 MG TOTAL) BY MOUTH DAILY. 90 tablet 3  . aspirin EC 81 MG tablet Take 81 mg by mouth daily.    Marland Kitchen atorvastatin (LIPITOR) 40 MG tablet TAKE 1 TABLET BY MOUTH EVERY DAY 90 tablet 1  . benazepril (LOTENSIN) 40 MG tablet TAKE 1 TABLET (40 MG TOTAL) BY MOUTH DAILY. 90 tablet 3  . diclofenac (VOLTAREN) 75 MG EC tablet TAKE 1 TABLET (75 MG TOTAL) BY MOUTH 2 (TWO) TIMES DAILY. 30 tablet 11  . JANUVIA 100 MG tablet TAKE 1 TABLET BY MOUTH EVERY DAY 90 tablet 1  . Levothyroxine Sodium 137 MCG CAPS Take 1 capsule (137 mcg total) by mouth daily before breakfast. 90 capsule  3  . meclizine (ANTIVERT) 25 MG tablet Take 1 tablet (25 mg total) by mouth 3 (three) times daily as needed for dizziness. 30 tablet 0  . metFORMIN (GLUCOPHAGE) 1000 MG tablet TAKE 1 TABLET BY MOUTH 2 (TWO) TIMES DAILY WITH A MEAL. NEEDS OFFICE VISIT AND LABS 180 tablet 1  . VIAGRA 100 MG tablet TAKE 1/2 TO 1 TABLET BY MOUTH EVERY DAY AS NEEDED FOR ERECTILE DYSFUNCTION (Patient not taking: Reported on 01/31/2020) 5 tablet 0   No current facility-administered medications on file prior to visit.   No Known Allergies Social History   Socioeconomic History  . Marital status: Married    Spouse name: Not on file  . Number of children: Not on file  . Years of education: Not on file  . Highest education level: Not on file  Occupational History  . Not on file  Tobacco Use  . Smoking status: Former Research scientist (life sciences)  . Smokeless tobacco: Former Network engineer and Sexual Activity  . Alcohol use: No  . Drug use: No  . Sexual activity: Not on file    Comment: separated from wife,   Other Topics Concern  . Not on file  Social History Narrative  . Not on file   Social Determinants of Health  Financial Resource Strain:   . Difficulty of Paying Living Expenses:   Food Insecurity:   . Worried About Programme researcher, broadcasting/film/video in the Last Year:   . Barista in the Last Year:   Transportation Needs:   . Freight forwarder (Medical):   Marland Kitchen Lack of Transportation (Non-Medical):   Physical Activity:   . Days of Exercise per Week:   . Minutes of Exercise per Session:   Stress:   . Feeling of Stress :   Social Connections:   . Frequency of Communication with Friends and Family:   . Frequency of Social Gatherings with Friends and Family:   . Attends Religious Services:   . Active Member of Clubs or Organizations:   . Attends Banker Meetings:   Marland Kitchen Marital Status:   Intimate Partner Violence:   . Fear of Current or Ex-Partner:   . Emotionally Abused:   Marland Kitchen Physically Abused:   .  Sexually Abused:      Review of Systems  All other systems reviewed and are negative.      Objective:   Physical Exam  Constitutional: He is oriented to person, place, and time. He appears well-developed and well-nourished. No distress.  HENT:  Right Ear: Hearing, tympanic membrane and ear canal normal.  Left Ear: Hearing, tympanic membrane and ear canal normal.  Mouth/Throat: Oropharynx is clear and moist.  Neck: No JVD present. No thyromegaly present.  Cardiovascular: Normal rate, regular rhythm, normal heart sounds and intact distal pulses.  No murmur heard. Pulmonary/Chest: Effort normal and breath sounds normal. No respiratory distress. He has no wheezes. He has no rales.  Abdominal: Soft. Bowel sounds are normal. He exhibits no distension. There is no abdominal tenderness. There is no rebound and no guarding.  Musculoskeletal:        General: No edema.     Cervical back: Neck supple.  Lymphadenopathy:    He has no cervical adenopathy.  Neurological: He is alert and oriented to person, place, and time. He has normal reflexes. No cranial nerve deficit. He exhibits normal muscle tone. Coordination normal.  Skin: He is not diaphoretic.  Vitals reviewed.         Assessment & Plan:  BPPV (benign paroxysmal positional vertigo), unspecified laterality  Symptoms are consistent with vertigo.  Begin meclizine 25 mg every 8 hours as needed.  Anticipate gradual improvement of his symptoms over the next week.

## 2020-02-16 ENCOUNTER — Other Ambulatory Visit: Payer: Self-pay | Admitting: Family Medicine

## 2020-03-22 ENCOUNTER — Other Ambulatory Visit: Payer: Self-pay | Admitting: Family Medicine

## 2020-04-23 DIAGNOSIS — S0502XA Injury of conjunctiva and corneal abrasion without foreign body, left eye, initial encounter: Secondary | ICD-10-CM | POA: Diagnosis not present

## 2020-04-26 DIAGNOSIS — S0502XD Injury of conjunctiva and corneal abrasion without foreign body, left eye, subsequent encounter: Secondary | ICD-10-CM | POA: Diagnosis not present

## 2020-05-12 ENCOUNTER — Other Ambulatory Visit: Payer: Self-pay | Admitting: Family Medicine

## 2020-05-14 ENCOUNTER — Other Ambulatory Visit: Payer: Self-pay

## 2020-05-14 MED ORDER — ATORVASTATIN CALCIUM 40 MG PO TABS: 40.0000 mg | ORAL_TABLET | Freq: Every day | ORAL | 0 refills | Status: DC

## 2020-08-16 DIAGNOSIS — H2511 Age-related nuclear cataract, right eye: Secondary | ICD-10-CM | POA: Diagnosis not present

## 2020-08-16 LAB — HM DIABETES EYE EXAM

## 2020-09-24 ENCOUNTER — Other Ambulatory Visit: Payer: Self-pay | Admitting: Family Medicine

## 2020-10-29 ENCOUNTER — Other Ambulatory Visit: Payer: Self-pay | Admitting: Family Medicine

## 2020-11-02 ENCOUNTER — Other Ambulatory Visit: Payer: Self-pay | Admitting: Family Medicine

## 2020-11-04 ENCOUNTER — Other Ambulatory Visit: Payer: Self-pay | Admitting: Family Medicine

## 2020-11-10 ENCOUNTER — Other Ambulatory Visit: Payer: Self-pay | Admitting: Family Medicine

## 2021-02-07 ENCOUNTER — Other Ambulatory Visit: Payer: Self-pay | Admitting: *Deleted

## 2021-02-07 MED ORDER — DICLOFENAC SODIUM 75 MG PO TBEC: DELAYED_RELEASE_TABLET | ORAL | 0 refills | Status: DC

## 2021-02-07 MED ORDER — BENAZEPRIL HCL 40 MG PO TABS: 40.0000 mg | ORAL_TABLET | Freq: Every day | ORAL | 0 refills | Status: DC

## 2021-02-07 NOTE — Addendum Note (Signed)
Addended by: Phillips Odor on: 02/07/2021 03:29 PM   Modules accepted: Orders

## 2021-02-19 ENCOUNTER — Other Ambulatory Visit: Payer: Self-pay | Admitting: *Deleted

## 2021-02-19 MED ORDER — ATORVASTATIN CALCIUM 40 MG PO TABS: 1.0000 | ORAL_TABLET | Freq: Every day | ORAL | 0 refills | Status: DC

## 2021-03-12 ENCOUNTER — Other Ambulatory Visit: Payer: Self-pay | Admitting: Family Medicine

## 2021-03-14 ENCOUNTER — Other Ambulatory Visit: Payer: Self-pay | Admitting: Family Medicine

## 2021-03-17 ENCOUNTER — Other Ambulatory Visit: Payer: Self-pay | Admitting: Family Medicine

## 2021-03-22 ENCOUNTER — Ambulatory Visit (INDEPENDENT_AMBULATORY_CARE_PROVIDER_SITE_OTHER): Payer: Medicare PPO | Admitting: Family Medicine

## 2021-03-22 ENCOUNTER — Encounter: Payer: Self-pay | Admitting: Family Medicine

## 2021-03-22 ENCOUNTER — Other Ambulatory Visit: Payer: Self-pay

## 2021-03-22 VITALS — BP 138/84 | HR 60 | Temp 98.2°F | Ht 72.0 in | Wt 204.6 lb

## 2021-03-22 DIAGNOSIS — I10 Essential (primary) hypertension: Secondary | ICD-10-CM | POA: Diagnosis not present

## 2021-03-22 DIAGNOSIS — E039 Hypothyroidism, unspecified: Secondary | ICD-10-CM | POA: Diagnosis not present

## 2021-03-22 DIAGNOSIS — Z125 Encounter for screening for malignant neoplasm of prostate: Secondary | ICD-10-CM | POA: Diagnosis not present

## 2021-03-22 DIAGNOSIS — E119 Type 2 diabetes mellitus without complications: Secondary | ICD-10-CM | POA: Diagnosis not present

## 2021-03-22 NOTE — Progress Notes (Signed)
Subjective:    Patient ID: [name], male    DOB: 08-06-1946, 75 y.o.   MRN: [id number]  Medication Refill  Patient is a 75 year old Caucasian male here today for a follow-up of his chronic medical conditions.  Has not had any labs since 1/21.  Patient denies any chest pain.  He denies any shortness of breath or dyspnea on exertion.  He is not reporting any numbness or tingling in his feet.  Diabetic foot exam was performed today.  I performed a Mini-Mental status exam and he scored 30 out of 30.  He denies any strokelike symptoms.  He denies any angina when he is playing golf.  He is not checking his blood sugars but he denies any polyuria, polydipsia, or blurry vision beyond his baseline blurry vision related to age.  He denies any recent weight loss.  He states that his weight is consistently around 205 pounds.  Overall he states that he feels well.  He is due for a shingles shot   Past Medical History:  Diagnosis Date   Diabetes mellitus without complication (HCC)    Hyperlipidemia    Hypertension    Kidney stones    Thyroid disease    hypothyroidism   Past Surgical History:  Procedure Laterality Date   APPENDECTOMY     HEMORRHOID SURGERY     Current Outpatient Medications on File Prior to Visit  Medication Sig Dispense Refill   allopurinol (ZYLOPRIM) 100 MG tablet TAKE 2 TABLETS (200 MG TOTAL) BY MOUTH DAILY. 180 tablet 0   amLODipine (NORVASC) 5 MG tablet TAKE 1 TABLET BY MOUTH EVERY DAY 90 tablet 3   aspirin EC 81 MG tablet Take 81 mg by mouth daily.     atorvastatin (LIPITOR) 40 MG tablet Take 1 tablet (40 mg total) by mouth daily. Requires office visit before any further refills can be given. 90 tablet 0   benazepril (LOTENSIN) 40 MG tablet Take 1 tablet (40 mg total) by mouth daily. 90 tablet 0   diclofenac (VOLTAREN) 75 MG EC tablet TAKE 1 TABLET BY MOUTH TWICE A DAY 90 tablet 0   JANUVIA 100 MG tablet TAKE 1 TABLET BY MOUTH EVERY DAY 90 tablet 1   levothyroxine  (SYNTHROID) 137 MCG tablet TAKE 1 TABLET (137 MCG TOTAL) BY MOUTH DAILY BEFORE BREAKFAST. 90 tablet 3   meclizine (ANTIVERT) 25 MG tablet TAKE 1 TABLET (25 MG TOTAL) BY MOUTH 3 (THREE) TIMES DAILY AS NEEDED FOR DIZZINESS. 30 tablet 0   metFORMIN (GLUCOPHAGE) 1000 MG tablet TAKE 1 TABLET BY MOUTH 2 (TWO) TIMES DAILY WITH A MEAL. NEEDS OFFICE VISIT AND LABS 60 tablet 0   VIAGRA 100 MG tablet TAKE 1/2 TO 1 TABLET BY MOUTH EVERY DAY AS NEEDED FOR ERECTILE DYSFUNCTION (Patient not taking: Reported on 01/31/2020) 5 tablet 0   No current facility-administered medications on file prior to visit.   No Known Allergies Social History   Socioeconomic History   Marital status: Married    Spouse name: Not on file   Number of children: Not on file   Years of education: Not on file   Highest education level: Not on file  Occupational History   Not on file  Tobacco Use   Smoking status: Former   Smokeless tobacco: Former  Substance and Sexual Activity   Alcohol use: No   Drug use: No   Sexual activity: Not on file    Comment: separated from wife,   Other Topics Concern  Not on file  Social History Narrative   Not on file   Social Determinants of Health   Financial Resource Strain: Not on file  Food Insecurity: Not on file  Transportation Needs: Not on file  Physical Activity: Not on file  Stress: Not on file  Social Connections: Not on file  Intimate Partner Violence: Not on file     Review of Systems  All other systems reviewed and are negative.     Objective:   Physical Exam Vitals reviewed.  Constitutional:      General: He is not in acute distress.    Appearance: Normal appearance. He is well-developed and normal weight. He is not diaphoretic.  HENT:     Head: Normocephalic and atraumatic.  Neck:     Thyroid: No thyromegaly.     Vascular: No JVD.  Cardiovascular:     Rate and Rhythm: Normal rate and regular rhythm.     Heart sounds: Normal heart sounds. No murmur  heard. Pulmonary:     Effort: Pulmonary effort is normal. No respiratory distress.     Breath sounds: Normal breath sounds. No wheezing or rales.  Abdominal:     General: Bowel sounds are normal. There is no distension.     Palpations: Abdomen is soft.     Tenderness: There is no abdominal tenderness. There is no guarding or rebound.  Musculoskeletal:     Cervical back: Neck supple.  Lymphadenopathy:     Cervical: No cervical adenopathy.  Neurological:     Mental Status: He is alert and oriented to person, place, and time.     Cranial Nerves: No cranial nerve deficit.     Motor: No abnormal muscle tone.     Coordination: Coordination normal.     Deep Tendon Reflexes: Reflexes are normal and symmetric.          Assessment & Plan:  Diabetes mellitus without complication (HCC) - Plan: CBC with Differential/Platelet, COMPLETE METABOLIC PANEL WITH GFR, Lipid panel, Hemoglobin A1c  Essential hypertension  Hypothyroidism, unspecified type - Plan: TSH  Prostate cancer screening - Plan: PSA Patient's A1c last time was 7.4.  He never began the Januvia as recommended.  He has not checked his sugars recently.  However he is asymptomatic.  Begin by collecting an A1c.  I would like to see his A1c less than 6.5.  I will also check a urine microalbumin.  Goal albumin to creatinine ratio is less than 30.  Check an LDL cholesterol.  Goal LDL cholesterol is less than 100.  Blood pressure today is acceptable.  Mini-Mental status exam is normal and diabetic foot exam is normal.  Check a TSH to ensure adequate dosage of levothyroxine.  Screen for prostate cancer with a PSA.  Encouraged the patient to receive a shingles vaccine

## 2021-03-23 LAB — COMPLETE METABOLIC PANEL WITH GFR
AG Ratio: 1.9 (calc) (ref 1.0–2.5)
ALT: 18 U/L (ref 9–46)
AST: 15 U/L (ref 10–35)
Albumin: 4.7 g/dL (ref 3.6–5.1)
Alkaline phosphatase (APISO): 111 U/L (ref 35–144)
BUN: 19 mg/dL (ref 7–25)
CO2: 26 mmol/L (ref 20–32)
Calcium: 9.5 mg/dL (ref 8.6–10.3)
Chloride: 103 mmol/L (ref 98–110)
Creat: 1.21 mg/dL (ref 0.70–1.28)
Globulin: 2.5 g/dL (calc) (ref 1.9–3.7)
Glucose, Bld: 153 mg/dL — ABNORMAL HIGH (ref 65–99)
Potassium: 4.3 mmol/L (ref 3.5–5.3)
Sodium: 139 mmol/L (ref 135–146)
Total Bilirubin: 0.7 mg/dL (ref 0.2–1.2)
Total Protein: 7.2 g/dL (ref 6.1–8.1)
eGFR: 62 mL/min/{1.73_m2} (ref >=60)

## 2021-03-23 LAB — LIPID PANEL
Cholesterol: 149 mg/dL (ref <200)
HDL: 33 mg/dL — ABNORMAL LOW (ref >=40)
LDL Cholesterol (Calc): 80 mg/dL (calc)
Non-HDL Cholesterol (Calc): 116 mg/dL (calc) (ref <130)
Total CHOL/HDL Ratio: 4.5 (calc) (ref <5.0)
Triglycerides: 271 mg/dL — ABNORMAL HIGH (ref <150)

## 2021-03-23 LAB — CBC WITH DIFFERENTIAL/PLATELET
Absolute Monocytes: 484 cells/uL (ref 200–950)
Basophils Absolute: 39 cells/uL (ref 0–200)
Basophils Relative: 0.7 %
Eosinophils Absolute: 231 cells/uL (ref 15–500)
Eosinophils Relative: 4.2 %
HCT: 42.5 % (ref 38.5–50.0)
Hemoglobin: 14.4 g/dL (ref 13.2–17.1)
Lymphs Abs: 1452 cells/uL (ref 850–3900)
MCH: 29.9 pg (ref 27.0–33.0)
MCHC: 33.9 g/dL (ref 32.0–36.0)
MCV: 88.4 fL (ref 80.0–100.0)
MPV: 11.4 fL (ref 7.5–12.5)
Monocytes Relative: 8.8 %
Neutro Abs: 3295 cells/uL (ref 1500–7800)
Neutrophils Relative %: 59.9 %
Platelets: 153 10*3/uL (ref 140–400)
RBC: 4.81 10*6/uL (ref 4.20–5.80)
RDW: 13 % (ref 11.0–15.0)
Total Lymphocyte: 26.4 %
WBC: 5.5 10*3/uL (ref 3.8–10.8)

## 2021-03-23 LAB — MICROALBUMIN, URINE: Microalb, Ur: 1.1 mg/dL

## 2021-03-23 LAB — HEMOGLOBIN A1C
Hgb A1c MFr Bld: 7.7 % of total Hgb — ABNORMAL HIGH (ref <5.7)
Mean Plasma Glucose: 174 mg/dL
eAG (mmol/L): 9.7 mmol/L

## 2021-03-23 LAB — PSA: PSA: 0.5 ng/mL (ref <=4.00)

## 2021-03-23 LAB — TSH: TSH: 1.31 mIU/L (ref 0.40–4.50)

## 2021-03-27 ENCOUNTER — Telehealth: Payer: Self-pay

## 2021-03-27 ENCOUNTER — Other Ambulatory Visit: Payer: Self-pay

## 2021-03-27 MED ORDER — PIOGLITAZONE HCL 30 MG PO TABS: 30.0000 mg | ORAL_TABLET | Freq: Every day | ORAL | 3 refills | Status: DC

## 2021-03-27 NOTE — Telephone Encounter (Signed)
Spoke with pt regarding lab results. Agreed to the actos 30mg , sent to pharmacy. Understands he is to f/u in 59mos

## 2021-03-29 ENCOUNTER — Encounter: Payer: Self-pay | Admitting: *Deleted

## 2021-04-28 ENCOUNTER — Other Ambulatory Visit: Payer: Self-pay | Admitting: Family Medicine

## 2021-05-16 ENCOUNTER — Other Ambulatory Visit: Payer: Self-pay | Admitting: Family Medicine

## 2021-06-06 ENCOUNTER — Other Ambulatory Visit: Payer: Self-pay | Admitting: Family Medicine

## 2021-07-12 ENCOUNTER — Other Ambulatory Visit: Payer: Self-pay

## 2021-07-12 ENCOUNTER — Ambulatory Visit: Payer: Medicare PPO

## 2021-07-13 ENCOUNTER — Other Ambulatory Visit: Payer: Self-pay | Admitting: Family Medicine

## 2021-07-18 ENCOUNTER — Ambulatory Visit (INDEPENDENT_AMBULATORY_CARE_PROVIDER_SITE_OTHER): Payer: Medicare PPO | Admitting: Family Medicine

## 2021-07-18 ENCOUNTER — Encounter: Payer: Self-pay | Admitting: Family Medicine

## 2021-07-18 ENCOUNTER — Other Ambulatory Visit: Payer: Self-pay

## 2021-07-18 VITALS — BP 138/82 | HR 69 | Temp 98.7°F | Resp 18 | Ht 72.0 in | Wt 206.0 lb

## 2021-07-18 DIAGNOSIS — Z23 Encounter for immunization: Secondary | ICD-10-CM | POA: Diagnosis not present

## 2021-07-18 DIAGNOSIS — M25562 Pain in left knee: Secondary | ICD-10-CM | POA: Diagnosis not present

## 2021-07-18 MED ORDER — DICLOFENAC SODIUM 1 % EX GEL: 2.0000 g | Freq: Four times a day (QID) | CUTANEOUS | 2 refills | Status: DC

## 2021-07-18 NOTE — Progress Notes (Signed)
Subjective:    Patient ID: [name], male    DOB: 11/15/1945, 75 y.o.   MRN: [id number]  Patient presents today complaining of left knee pain.  He states that 2 weeks ago he was bending over when he felt a popping sensation in his left knee.  Ever since that time he has had pain and stiffness in his left knee.  There is no laxity to varus or valgus stress.  There is no evidence of an ACL or PCL tear.  There is no erythema or effusion.  There is no tenderness to palpation although he does have pain with Apley grind.  He states that the pain is gradually getting better.  He also has some pain and stiffness in the MCP and PIP joints on both hands.  He has not tried any type of NSAID for this.  Past Medical History:  Diagnosis Date   Diabetes mellitus without complication (HCC)    Hyperlipidemia    Hypertension    Kidney stones    Thyroid disease    hypothyroidism   Past Surgical History:  Procedure Laterality Date   APPENDECTOMY     HEMORRHOID SURGERY     Current Outpatient Medications on File Prior to Visit  Medication Sig Dispense Refill   amLODipine (NORVASC) 5 MG tablet TAKE 1 TABLET BY MOUTH EVERY DAY 90 tablet 3   aspirin EC 81 MG tablet Take 81 mg by mouth daily.     atorvastatin (LIPITOR) 40 MG tablet TAKE 1 TABLETBY MOUTH DAILY. REQUIRES OFFICE VISIT BEFORE ANY FURTHER REFILLS CAN BE GIVEN 90 tablet 0   benazepril (LOTENSIN) 40 MG tablet TAKE 1 TABLET BY MOUTH EVERY DAY 90 tablet 0   diclofenac (VOLTAREN) 75 MG EC tablet TAKE 1 TABLET BY MOUTH TWICE A DAY 90 tablet 0   levothyroxine (SYNTHROID) 137 MCG tablet TAKE 1 TABLET (137 MCG TOTAL) BY MOUTH DAILY BEFORE BREAKFAST. 90 tablet 3   metFORMIN (GLUCOPHAGE) 1000 MG tablet Take 1 tablet (1,000 mg total) by mouth 2 (two) times daily with a meal. 180 tablet 1   pioglitazone (ACTOS) 30 MG tablet TAKE 1 TABLET BY MOUTH EVERY DAY 90 tablet 1   VIAGRA 100 MG tablet TAKE 1/2 TO 1 TABLET BY MOUTH EVERY DAY AS NEEDED FOR ERECTILE  DYSFUNCTION 5 tablet 0   meclizine (ANTIVERT) 25 MG tablet TAKE 1 TABLET (25 MG TOTAL) BY MOUTH 3 (THREE) TIMES DAILY AS NEEDED FOR DIZZINESS. (Patient not taking: Reported on 07/18/2021) 30 tablet 0   No current facility-administered medications on file prior to visit.   No Known Allergies Social History   Socioeconomic History   Marital status: Married    Spouse name: Not on file   Number of children: Not on file   Years of education: Not on file   Highest education level: Not on file  Occupational History   Not on file  Tobacco Use   Smoking status: Former   Smokeless tobacco: Former  Substance and Sexual Activity   Alcohol use: No   Drug use: No   Sexual activity: Not on file    Comment: separated from wife,   Other Topics Concern   Not on file  Social History Narrative   Not on file   Social Determinants of Health   Financial Resource Strain: Not on file  Food Insecurity: Not on file  Transportation Needs: Not on file  Physical Activity: Not on file  Stress: Not on file  Social Connections: Not  on file  Intimate Partner Violence: Not on file     Review of Systems  Musculoskeletal:  Positive for arthritis.  All other systems reviewed and are negative.     Objective:   Physical Exam Vitals reviewed.  Constitutional:      General: He is not in acute distress.    Appearance: Normal appearance. He is well-developed and normal weight. He is not diaphoretic.  HENT:     Head: Normocephalic and atraumatic.  Neck:     Thyroid: No thyromegaly.     Vascular: No JVD.  Cardiovascular:     Rate and Rhythm: Normal rate and regular rhythm.     Heart sounds: Normal heart sounds. No murmur heard. Pulmonary:     Effort: Pulmonary effort is normal. No respiratory distress.     Breath sounds: Normal breath sounds. No wheezing or rales.  Abdominal:     General: Bowel sounds are normal. There is no distension.     Palpations: Abdomen is soft.     Tenderness: There is no  abdominal tenderness. There is no guarding or rebound.  Musculoskeletal:     Cervical back: Neck supple.     Left knee: No bony tenderness or crepitus. Decreased range of motion. No tenderness. Abnormal meniscus.  Lymphadenopathy:     Cervical: No cervical adenopathy.  Neurological:     Mental Status: He is alert and oriented to person, place, and time.     Cranial Nerves: No cranial nerve deficit.     Motor: No abnormal muscle tone.     Coordination: Coordination normal.     Deep Tendon Reflexes: Reflexes are normal and symmetric.          Assessment & Plan:  Need for immunization against influenza - Plan: Flu Vaccine QUAD High Dose(Fluad)  Acute pain of left knee I believe the patient may have a cartilage tear/meniscal tear in his left knee.  However the pain is getting better with diclofenac so we will continue this for the present time.  Recheck if no better in 2 weeks.  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  We will trial some topical Voltaren gel.  If pain worsens, consider checking x-rays as well as an RF factor

## 2021-07-19 ENCOUNTER — Ambulatory Visit (INDEPENDENT_AMBULATORY_CARE_PROVIDER_SITE_OTHER): Payer: Medicare PPO

## 2021-07-19 VITALS — Ht 72.0 in | Wt 206.0 lb

## 2021-07-19 DIAGNOSIS — Z0000 Encounter for general adult medical examination without abnormal findings: Secondary | ICD-10-CM

## 2021-07-19 NOTE — Patient Instructions (Signed)
[name] , Thank you for taking time to come for your Medicare Wellness Visit. I appreciate your ongoing commitment to your health goals. Please review the following plan we discussed and let me know if I can assist you in the future.   Screening recommendations/referrals: Colonoscopy: No longer required due to age.  Recommended yearly ophthalmology/optometry visit for glaucoma screening and checkup Recommended yearly dental visit for hygiene and checkup  Vaccinations: Influenza vaccine: Done 07/18/2021 Pneumococcal vaccine: Done 07/26/2013 and 12/22/2014 Tdap vaccine: Due Repeat in 10 years  Shingles vaccine: Shingrix discussed. Please contact your pharmacy for coverage information.     Covid-19: Done 10/24/2019, 11/14/2019 and 08/21/20.  Advanced directives: Advance directive discussed with you today. I have provided a copy for you to complete at home and have notarized. Once this is complete please bring a copy in to our office so we can scan it into your chart. Mailed.  Conditions/risks identified: Aim for 30 minutes of exercise or brisk walking each day, drink 6-8 glasses of water and eat lots of fruits and vegetables.   Next appointment: Follow up in one year for your annual wellness visit. 2023.  Preventive Care 77 Years and Older, Male  Preventive care refers to lifestyle choices and visits with your health care provider that can promote health and wellness. What does preventive care include? A yearly physical exam. This is also called an annual well check. Dental exams once or twice a year. Routine eye exams. Ask your health care provider how often you should have your eyes checked. Personal lifestyle choices, including: Daily care of your teeth and gums. Regular physical activity. Eating a healthy diet. Avoiding tobacco and drug use. Limiting alcohol use. Practicing safe sex. Taking low doses of aspirin every day. Taking vitamin and mineral supplements as  recommended by your health care provider. What happens during an annual well check? The services and screenings done by your health care provider during your annual well check will depend on your age, overall health, lifestyle risk factors, and family history of disease. Counseling  Your health care provider may ask you questions about your: Alcohol use. Tobacco use. Drug use. Emotional well-being. Home and relationship well-being. Sexual activity. Eating habits. History of falls. Memory and ability to understand (cognition). Work and work Astronomer. Screening  You may have the following tests or measurements: Height, weight, and BMI. Blood pressure. Lipid and cholesterol levels. These may be checked every 5 years, or more frequently if you are over 51 years old. Skin check. Lung cancer screening. You may have this screening every year starting at age 21 if you have a 30-pack-year history of smoking and currently smoke or have quit within the past 15 years. Fecal occult blood test (FOBT) of the stool. You may have this test every year starting at age 39. Flexible sigmoidoscopy or colonoscopy. You may have a sigmoidoscopy every 5 years or a colonoscopy every 10 years starting at age 19. Prostate cancer screening. Recommendations will vary depending on your family history and other risks. Hepatitis C blood test. Hepatitis B blood test. Sexually transmitted disease (STD) testing. Diabetes screening. This is done by checking your blood sugar (glucose) after you have not eaten for a while (fasting). You may have this done every 1-3 years. Abdominal aortic aneurysm (AAA) screening. You may need this if you are a current or former smoker. Osteoporosis. You may be screened starting at age 24 if you are at high risk. Talk with your health care provider about  your test results, treatment options, and if necessary, the need for more tests. Vaccines  Your health care provider may recommend  certain vaccines, such as: Influenza vaccine. This is recommended every year. Tetanus, diphtheria, and acellular pertussis (Tdap, Td) vaccine. You may need a Td booster every 10 years. Zoster vaccine. You may need this after age 74. Pneumococcal 13-valent conjugate (PCV13) vaccine. One dose is recommended after age 64. Pneumococcal polysaccharide (PPSV23) vaccine. One dose is recommended after age 27. Talk to your health care provider about which screenings and vaccines you need and how often you need them. This information is not intended to replace advice given to you by your health care provider. Make sure you discuss any questions you have with your health care provider. Document Released: 09/14/2015 Document Revised: 05/07/2016 Document Reviewed: 06/19/2015 Elsevier Interactive Patient Education  2017 [place] Prevention in the Home Falls can cause injuries. They can happen to people of all ages. There are many things you can do to make your home safe and to help prevent falls. What can I do on the outside of my home? Regularly fix the edges of walkways and driveways and fix any cracks. Remove anything that might make you trip as you walk through a door, such as a raised step or threshold. Trim any bushes or trees on the path to your home. Use bright outdoor lighting. Clear any walking paths of anything that might make someone trip, such as rocks or tools. Regularly check to see if handrails are loose or broken. Make sure that both sides of any steps have handrails. Any raised decks and porches should have guardrails on the edges. Have any leaves, snow, or ice cleared regularly. Use sand or salt on walking paths during winter. Clean up any spills in your garage right away. This includes oil or grease spills. What can I do in the bathroom? Use night lights. Install grab bars by the toilet and in the tub and shower. Do not use towel bars as grab bars. Use non-skid mats or  decals in the tub or shower. If you need to sit down in the shower, use a plastic, non-slip stool. Keep the floor dry. Clean up any water that spills on the floor as soon as it happens. Remove soap buildup in the tub or shower regularly. Attach bath mats securely with double-sided non-slip rug tape. Do not have throw rugs and other things on the floor that can make you trip. What can I do in the bedroom? Use night lights. Make sure that you have a light by your bed that is easy to reach. Do not use any sheets or blankets that are too big for your bed. They should not hang down onto the floor. Have a firm chair that has side arms. You can use this for support while you get dressed. Do not have throw rugs and other things on the floor that can make you trip. What can I do in the kitchen? Clean up any spills right away. Avoid walking on wet floors. Keep items that you use a lot in easy-to-reach places. If you need to reach something above you, use a strong step stool that has a grab bar. Keep electrical cords out of the way. Do not use floor polish or wax that makes floors slippery. If you must use wax, use non-skid floor wax. Do not have throw rugs and other things on the floor that can make you trip. What can I do with  my stairs? Do not leave any items on the stairs. Make sure that there are handrails on both sides of the stairs and use them. Fix handrails that are broken or loose. Make sure that handrails are as long as the stairways. Check any carpeting to make sure that it is firmly attached to the stairs. Fix any carpet that is loose or worn. Avoid having throw rugs at the top or bottom of the stairs. If you do have throw rugs, attach them to the floor with carpet tape. Make sure that you have a light switch at the top of the stairs and the bottom of the stairs. If you do not have them, ask someone to add them for you. What else can I do to help prevent falls? Wear shoes that: Do not  have high heels. Have rubber bottoms. Are comfortable and fit you well. Are closed at the toe. Do not wear sandals. If you use a stepladder: Make sure that it is fully opened. Do not climb a closed stepladder. Make sure that both sides of the stepladder are locked into place. Ask someone to hold it for you, if possible. Clearly mark and make sure that you can see: Any grab bars or handrails. First and last steps. Where the edge of each step is. Use tools that help you move around (mobility aids) if they are needed. These include: Canes. Walkers. Scooters. Crutches. Turn on the lights when you go into a dark area. Replace any light bulbs as soon as they burn out. Set up your furniture so you have a clear path. Avoid moving your furniture around. If any of your floors are uneven, fix them. If there are any pets around you, be aware of where they are. Review your medicines with your doctor. Some medicines can make you feel dizzy. This can increase your chance of falling. Ask your doctor what other things that you can do to help prevent falls. This information is not intended to replace advice given to you by your health care provider. Make sure you discuss any questions you have with your health care provider. Document Released: 06/14/2009 Document Revised: 01/24/2016 Document Reviewed: 09/22/2014 Elsevier Interactive Patient Education  2017 [employer].

## 2021-07-19 NOTE — Progress Notes (Signed)
Subjective:   [name] is a 75 y.o. male who presents for an Initial Medicare Annual Wellness Visit. Virtual Visit via Telephone Note  I connected with  Caprice Kluver on 07/19/21 at  2:00 PM EST by telephone and verified that I am speaking with the correct person using two identifiers.  Location: Patient: Home Provider: [facility] Persons participating in the virtual visit: patient/Nurse Health Advisor   I discussed the limitations, risks, security and privacy concerns of performing an evaluation and management service by telephone and the availability of in person appointments. The patient expressed understanding and agreed to proceed.  Interactive audio and video telecommunications were attempted between this nurse and patient, however failed, due to patient having technical difficulties OR patient did not have access to video capability.  We continued and completed visit with audio only.  Some vital signs may be absent or patient reported.   Darral Dash, LPN  Review of Systems     Cardiac Risk Factors include: advanced age (>33men, >71 women);diabetes mellitus;hypertension;male gender     Objective:    Today's Vitals   07/19/21 1412  Weight: 206 lb (93.4 kg)  Height: 6' (1.829 m)   Body mass index is 27.94 kg/m.  Advanced Directives 07/19/2021  Does Patient Have a Medical Advance Directive? No  Would patient like information on creating a medical advance directive? No - Patient declined    Current Medications (verified) Outpatient Encounter Medications as of 07/19/2021  Medication Sig   amLODipine (NORVASC) 5 MG tablet TAKE 1 TABLET BY MOUTH EVERY DAY   aspirin EC 81 MG tablet Take 81 mg by mouth daily.   atorvastatin (LIPITOR) 40 MG tablet TAKE 1 TABLETBY MOUTH DAILY. REQUIRES OFFICE VISIT BEFORE ANY FURTHER REFILLS CAN BE GIVEN   benazepril (LOTENSIN) 40 MG tablet TAKE 1 TABLET BY MOUTH EVERY DAY   diclofenac (VOLTAREN) 75 MG EC tablet TAKE 1 TABLET  BY MOUTH TWICE A DAY   diclofenac Sodium (VOLTAREN) 1 % GEL Apply 2 g topically 4 (four) times daily.   levothyroxine (SYNTHROID) 137 MCG tablet TAKE 1 TABLET (137 MCG TOTAL) BY MOUTH DAILY BEFORE BREAKFAST.   meclizine (ANTIVERT) 25 MG tablet TAKE 1 TABLET (25 MG TOTAL) BY MOUTH 3 (THREE) TIMES DAILY AS NEEDED FOR DIZZINESS.   metFORMIN (GLUCOPHAGE) 1000 MG tablet Take 1 tablet (1,000 mg total) by mouth 2 (two) times daily with a meal.   pioglitazone (ACTOS) 30 MG tablet TAKE 1 TABLET BY MOUTH EVERY DAY   VIAGRA 100 MG tablet TAKE 1/2 TO 1 TABLET BY MOUTH EVERY DAY AS NEEDED FOR ERECTILE DYSFUNCTION   [DISCONTINUED] allopurinol (ZYLOPRIM) 100 MG tablet TAKE 2 TABLETS (200 MG TOTAL) BY MOUTH DAILY. (Patient not taking: Reported on 03/22/2021)   [DISCONTINUED] JANUVIA 100 MG tablet TAKE 1 TABLET BY MOUTH EVERY DAY (Patient not taking: Reported on 03/22/2021)   No facility-administered encounter medications on file as of 07/19/2021.    Allergies (verified) Patient has no known allergies.   History: Past Medical History:  Diagnosis Date   Diabetes mellitus without complication (HCC)    Hyperlipidemia    Hypertension    Kidney stones    Thyroid disease    hypothyroidism   Past Surgical History:  Procedure Laterality Date   APPENDECTOMY     HEMORRHOID SURGERY     History reviewed. No pertinent family history. Social History   Socioeconomic History   Marital status: Divorced    Spouse name: Not on file  Number of children: 1   Years of education: Not on file   Highest education level: Not on file  Occupational History   Not on file  Tobacco Use   Smoking status: Former   Smokeless tobacco: Former  Substance and Sexual Activity   Alcohol use: No   Drug use: No   Sexual activity: Not on file    Comment: separated from wife,   Other Topics Concern   Not on file  Social History Narrative   2 daughters and 1 son   4 grandchildren   3 great grandchildren   Social  Determinants of Health   Financial Resource Strain: Low Risk    Difficulty of Paying Living Expenses: Not hard at all  Food Insecurity: No Food Insecurity   Worried About Programme researcher, broadcasting/film/video in the Last Year: Never true   Barista in the Last Year: Never true  Transportation Needs: No Transportation Needs   Lack of Transportation (Medical): No   Lack of Transportation (Non-Medical): No  Physical Activity: Insufficiently Active   Days of Exercise per Week: 3 days   Minutes of Exercise per Session: 30 min  Stress: No Stress Concern Present   Feeling of Stress : Not at all  Social Connections: Socially Integrated   Frequency of Communication with Friends and Family: More than three times a week   Frequency of Social Gatherings with Friends and Family: More than three times a week   Attends Religious Services: More than 4 times per year   Active Member of [employer] or Organizations: Yes   Attends Engineer, structural: More than 4 times per year   Marital Status: Married    Tobacco Counseling Counseling given: Not Answered   Clinical Intake:  Pre-visit preparation completed: Yes  Pain : No/denies pain     BMI - recorded: 27.94 Nutritional Status: BMI 25 -29 Overweight Nutritional Risks: None Diabetes: Yes CBG done?: No Did pt. bring in CBG monitor from home?: No  How often do you need to have someone help you when you read instructions, pamphlets, or other written materials from your doctor or pharmacy?: 1 - Never  Diabetic?Nutrition Risk Assessment:  Has the patient had any N/V/D within the last 2 months?  No  Does the patient have any non-healing wounds?  No  Has the patient had any unintentional weight loss or weight gain?  No   Diabetes:  Is the patient diabetic?  Yes  If diabetic, was a CBG obtained today?  No  Did the patient bring in their glucometer from home?  No  Phone visit. How often do you monitor your CBG's? Checks A1C at office.    Financial Strains and Diabetes Management:  Are you having any financial strains with the device, your supplies or your medication? No .  Does the patient want to be seen by Chronic Care Management for management of their diabetes?  No  Would the patient like to be referred to a Nutritionist or for Diabetic Management?  No   Diabetic Exams:  Diabetic Eye Exam: Completed 08/16/2020. Pt has been advised about the importance in completing this exam.  Diabetic Foot Exam: Completed 09/27/2019. Pt has been advised about the importance in completing this exam.  Interpreter Needed?: No  Information entered by :: MJ Maude Hettich, LPN   Activities of Daily Living In your present state of health, do you have any difficulty performing the following activities: 07/19/2021  Hearing? N  Vision? N  Difficulty concentrating or making decisions? N  Walking or climbing stairs? N  Dressing or bathing? N  Doing errands, shopping? N  Preparing Food and eating ? N  Using the Toilet? N  In the past six months, have you accidently leaked urine? N  Do you have problems with loss of bowel control? N  Managing your Medications? N  Managing your Finances? N  Housekeeping or managing your Housekeeping? N  Some recent data might be hidden    Patient Care Team: Donita Brooks, MD as PCP - General (Family Medicine)  Indicate any recent Medical Services you may have received from other than [facility] providers in the past year (date may be approximate).     Assessment:   This is a routine wellness examination for Zacory.  Hearing/Vision screen Hearing Screening - Comments:: No hearing issues.   Vision Screening - Comments:: Last eye exam 08/16/2020.   Dietary issues and exercise activities discussed: Current Exercise Habits: Home exercise routine, Type of exercise: walking, Time (Minutes): 30, Frequency (Times/Week): 3, Weekly Exercise (Minutes/Week): 90, Intensity: Mild, Exercise limited by: cardiac  condition(s)   Goals Addressed             This Visit's Progress    Exercise 3x per week (30 min per time)       Increase exercise and stay in good health.       Depression Screen PHQ 2/9 Scores 07/19/2021 09/27/2019 09/27/2019 07/13/2018 07/09/2017  PHQ - 2 Score 0 0 0 0 0    Fall Risk Fall Risk  07/19/2021 09/27/2019 09/27/2019 07/13/2018 07/09/2017  Falls in the past year? 0 0 0 1 No  Number falls in past yr: 0 0 - 0 -  Injury with Fall? 0 0 - 0 -  Risk for fall due to : No Fall Risks - - - -  Follow up Falls prevention discussed - Falls evaluation completed Falls evaluation completed -    FALL RISK PREVENTION PERTAINING TO THE HOME:  Any stairs in or around the home? No  If so, are there any without handrails? No  Home free of loose throw rugs in walkways, pet beds, electrical cords, etc? Yes  Adequate lighting in your home to reduce risk of falls? Yes   ASSISTIVE DEVICES UTILIZED TO PREVENT FALLS:  Life alert? No  Use of a cane, walker or w/c? No  Grab bars in the bathroom? Yes  Shower chair or bench in shower? No  Elevated toilet seat or a handicapped toilet? No   TIMED UP AND GO:  Was the test performed? No .  Phone visit.  Cognitive Function:     6CIT Screen 07/19/2021  What Year? 0 points  What month? 0 points  What time? 0 points  Count back from 20 0 points  Months in reverse 0 points  Repeat phrase 0 points  Total Score 0    Immunizations Immunization History  Administered Date(s) Administered   Fluad Quad(high Dose 65+) 07/18/2021   Influenza, High Dose Seasonal PF 07/09/2017, 07/13/2018   PFIZER(Purple Top)SARS-COV-2 Vaccination 10/24/2019, 11/14/2019, 08/21/2020   Pneumococcal Conjugate-13 07/26/2013   Pneumococcal Polysaccharide-23 07/29/2011, 12/22/2014    TDAP status: Due, Education has been provided regarding the importance of this vaccine. Advised may receive this vaccine at local pharmacy or Health Dept. Aware to provide a copy of  the vaccination record if obtained from local pharmacy or Health Dept. Verbalized acceptance and understanding.  Flu Vaccine status: Up to date  Pneumococcal vaccine status:  Up to date  Covid-19 vaccine status: Information provided on how to obtain vaccines.   Qualifies for Shingles Vaccine? No   Zostavax completed  Pt states he has never had chicken pox.   Shingrix Completed?: No.    Education has been provided regarding the importance of this vaccine. Patient has been advised to call insurance company to determine out of pocket expense if they have not yet received this vaccine. Advised may also receive vaccine at local pharmacy or Health Dept. Verbalized acceptance and understanding.  Screening Tests Health Maintenance  Topic Date Due   Zoster Vaccines- Shingrix (1 of 2) Never done   FOOT EXAM  09/26/2020   COVID-19 Vaccine (4 - Booster for Pfizer series) 10/16/2020   TETANUS/TDAP  03/22/2022 (Originally 01/21/1965)   OPHTHALMOLOGY EXAM  08/16/2021   HEMOGLOBIN A1C  09/22/2021   Pneumonia Vaccine 33+ Years old  Completed   INFLUENZA VACCINE  Completed   HPV VACCINES  Aged Out   Hepatitis C Screening  Discontinued    Health Maintenance  Health Maintenance Due  Topic Date Due   Zoster Vaccines- Shingrix (1 of 2) Never done   FOOT EXAM  09/26/2020   COVID-19 Vaccine (4 - Booster for Pfizer series) 10/16/2020    Colorectal cancer screening: Type of screening: Colonoscopy. Completed 2010. Repeat every 10 years  Lung Cancer Screening: (Low Dose CT Chest recommended if Age 29-80 years, 30 pack-year currently smoking OR have quit w/in 15years.) does not qualify.  Quit smoking over 15 years ago.  Additional Screening:  Hepatitis C Screening: does qualify; Completed NOT COMPLETED.  Vision Screening: Recommended annual ophthalmology exams for early detection of glaucoma and other disorders of the eye. Is the patient up to date with their annual eye exam?  Yes  Who is the  provider or what is the name of the office in which the patient attends annual eye exams? [facility] If pt is not established with a provider, would they like to be referred to a provider to establish care? No .   Dental Screening: Recommended annual dental exams for proper oral hygiene  Community Resource Referral / Chronic Care Management: CRR required this visit?  No   CCM required this visit?  No      Plan:     I have personally reviewed and noted the following in the patient's chart:   Medical and social history Use of alcohol, tobacco or illicit drugs  Current medications and supplements including opioid prescriptions. Patient is not currently taking opioid prescriptions. Functional ability and status Nutritional status Physical activity Advanced directives List of other physicians Hospitalizations, surgeries, and ER visits in previous 12 months Vitals Screenings to include cognitive, depression, and falls Referrals and appointments  In addition, I have reviewed and discussed with patient certain preventive protocols, quality metrics, and best practice recommendations. A written personalized care plan for preventive services as well as general preventive health recommendations were provided to patient.     Darral Dash, LPN   16/06/[id number]   Nurse Notes: Pt states he had a colonoscopy in his early 85's. Pt states he does not want to repeat. Discussed Td and Shingles vaccines and how to obtain. Pt declines Shingles vaccines due to never having chicken pox. Advanced Directive mailed to patient.

## 2021-08-31 ENCOUNTER — Other Ambulatory Visit: Payer: Self-pay | Admitting: Family Medicine

## 2021-10-30 ENCOUNTER — Other Ambulatory Visit: Payer: Self-pay | Admitting: Family Medicine

## 2021-12-27 ENCOUNTER — Telehealth: Payer: Self-pay

## 2021-12-27 MED ORDER — LEVOTHYROXINE SODIUM 137 MCG PO TABS: ORAL_TABLET | ORAL | 0 refills | Status: DC

## 2021-12-27 NOTE — Telephone Encounter (Signed)
Rx sent to pharmacy.  ? ?Patient will need OV and labs 03/2022. ?

## 2021-12-27 NOTE — Telephone Encounter (Signed)
Pharmacy faxed a refill request for ? ?levothyroxine (SYNTHROID) 137 MCG tablet [id number]  ?  Order Details ?Dose, Route, Frequency: As Directed  ?Dispense Quantity: 90 tablet Refills: 3   ?     ?Sig: TAKE 1 TABLET (137 MCG TOTAL) BY MOUTH DAILY BEFORE BREAKFAST.  ?     ?Start Date: 11/02/20 End Date: --  ?Written Date: 11/02/20 Expiration Date: 11/02/21  ? ?

## 2022-01-17 ENCOUNTER — Other Ambulatory Visit: Payer: Self-pay | Admitting: Family Medicine

## 2022-01-17 NOTE — Telephone Encounter (Signed)
Requested medication (s) are due for refill today: yes  Requested medication (s) are on the active medication list: yes  Last refill:  05/16/21 #180 1 refills  Future visit scheduled: no  Notes to clinic:  called patient to schedule appt for medication refills. Patient requesting to call back after review of his calender. Protocol failed last Hbg A1c 03/22/21. Do you want to give courtesy refill?     Requested Prescriptions  Pending Prescriptions Disp Refills   metFORMIN (GLUCOPHAGE) 1000 MG tablet [Pharmacy Med Name: METFORMIN HCL 1,000 MG TABLET] 180 tablet 1    Sig: Take 1 tablet (1,000 mg total) by mouth 2 (two) times daily with a meal.     Endocrinology:  Diabetes - Biguanides Failed - 01/17/2022  2:14 AM      Failed - HBA1C is between 0 and 7.9 and within 180 days    Hgb A1c MFr Bld  Date Value Ref Range Status  03/22/2021 7.7 (H) <5.7 % of total Hgb Final    Comment:    For someone without known diabetes, a hemoglobin A1c value of 6.5% or greater indicates that they may have  diabetes and this should be confirmed with a follow-up  test. . For someone with known diabetes, a value <7% indicates  that their diabetes is well controlled and a value  greater than or equal to 7% indicates suboptimal  control. A1c targets should be individualized based on  duration of diabetes, age, comorbid conditions, and  other considerations. . Currently, no consensus exists regarding use of hemoglobin A1c for diagnosis of diabetes for children. .          Failed - B12 Level in normal range and within 720 days    No results found for: VITAMINB12       Failed - Valid encounter within last 6 months    Recent Outpatient Visits           6 months ago Need for immunization against influenza   [place], Warren T, MD   10 months ago Diabetes mellitus without complication ([place])   [place] Susy Frizzle, MD   1 year ago BPPV (benign  paroxysmal positional vertigo), unspecified laterality   [place] Susy Frizzle, MD   2 years ago Diabetes mellitus without complication ([place])   [place] Susy Frizzle, MD   3 years ago Hypothyroidism, unspecified type   [place] Pickard, Cammie Mcgee, MD               Passed - Cr in normal range and within 360 days    Creat  Date Value Ref Range Status  03/22/2021 1.21 0.70 - 1.28 mg/dL Final   Creatinine, Urine  Date Value Ref Range Status  09/23/2019 167 20 - 320 mg/dL Final         Passed - eGFR in normal range and within 360 days    GFR, Est African American  Date Value Ref Range Status  07/13/2018 69 > OR = 60 mL/min/1.30m2 Final   GFR, Est Non African American  Date Value Ref Range Status  07/13/2018 60 > OR = 60 mL/min/1.79m2 Final   eGFR  Date Value Ref Range Status  03/22/2021 62 > OR = 60 mL/min/1.40m2 Final    Comment:    The eGFR is based on the CKD-EPI 2021 equation. To calculate  the new eGFR from a previous Creatinine or Cystatin  C result, go to https://www.kidney.org/professionals/ kdoqi/gfr%5Fcalculator          Passed - CBC within normal limits and completed in the last 12 months    WBC  Date Value Ref Range Status  03/22/2021 5.5 3.8 - 10.8 Thousand/uL Final   RBC  Date Value Ref Range Status  03/22/2021 4.81 4.20 - 5.80 Million/uL Final   Hemoglobin  Date Value Ref Range Status  03/22/2021 14.4 13.2 - 17.1 g/dL Final   HCT  Date Value Ref Range Status  03/22/2021 42.5 38.5 - 50.0 % Final   MCHC  Date Value Ref Range Status  03/22/2021 33.9 32.0 - 36.0 g/dL Final   [facility]  Date Value Ref Range Status  03/22/2021 29.9 27.0 - 33.0 pg Final   MCV  Date Value Ref Range Status  03/22/2021 88.4 80.0 - 100.0 fL Final   No results found for: PLTCOUNTKUC, LABPLAT, POCPLA RDW  Date Value Ref Range Status  03/22/2021 13.0 11.0 - 15.0 % Final

## 2022-01-17 NOTE — Telephone Encounter (Signed)
Called patient to schedule appt for medication refills. Patient requesting to call back to view his calender before scheduling appt.

## 2022-01-21 ENCOUNTER — Other Ambulatory Visit: Payer: Self-pay | Admitting: Family Medicine

## 2022-01-22 NOTE — Telephone Encounter (Signed)
Requested Prescriptions  Pending Prescriptions Disp Refills  . pioglitazone (ACTOS) 30 MG tablet [Pharmacy Med Name: PIOGLITAZONE HCL 30 MG TABLET] 90 tablet 1    Sig: TAKE 1 TABLET BY MOUTH EVERY DAY     Endocrinology:  Diabetes - Glitazones - pioglitazone Failed - 01/21/2022  2:04 AM      Failed - HBA1C is between 0 and 7.9 and within 180 days    Hgb A1c MFr Bld  Date Value Ref Range Status  03/22/2021 7.7 (H) <5.7 % of total Hgb Final    Comment:    For someone without known diabetes, a hemoglobin A1c value of 6.5% or greater indicates that they may have  diabetes and this should be confirmed with a follow-up  test. . For someone with known diabetes, a value <7% indicates  that their diabetes is well controlled and a value  greater than or equal to 7% indicates suboptimal  control. A1c targets should be individualized based on  duration of diabetes, age, comorbid conditions, and  other considerations. . Currently, no consensus exists regarding use of hemoglobin A1c for diagnosis of diabetes for children. .          Failed - Valid encounter within last 6 months    Recent Outpatient Visits          6 months ago Need for immunization against influenza   [facility] Donita Brooks, MD   10 months ago Diabetes mellitus without complication [facility])   [facility] Donita Brooks, MD   1 year ago BPPV (benign paroxysmal positional vertigo), unspecified laterality   [place] [facility] Donita Brooks, MD   2 years ago Diabetes mellitus without complication [facility])   [facility] Donita Brooks, MD   3 years ago Hypothyroidism, unspecified type   [facility] Pickard, Priscille Heidelberg, MD

## 2022-01-23 ENCOUNTER — Ambulatory Visit (INDEPENDENT_AMBULATORY_CARE_PROVIDER_SITE_OTHER): Payer: Medicare PPO | Admitting: Family Medicine

## 2022-01-23 VITALS — BP 158/78 | HR 86 | Temp 97.8°F | Ht 73.0 in | Wt 205.2 lb

## 2022-01-23 DIAGNOSIS — E119 Type 2 diabetes mellitus without complications: Secondary | ICD-10-CM

## 2022-01-23 DIAGNOSIS — I10 Essential (primary) hypertension: Secondary | ICD-10-CM

## 2022-01-23 DIAGNOSIS — E039 Hypothyroidism, unspecified: Secondary | ICD-10-CM

## 2022-01-23 NOTE — Progress Notes (Signed)
Subjective:    Patient ID: [name], male    DOB: 11/27/1945, 76 y.o.   MRN: [id number]  Medication Refill  Patient is long overdue for fasting lab work.  He is not checking his blood sugar.  Blood pressure today is elevated at 158/78.  However this is not the reason he came.  He has several orthopedic concerns.  First, his right fourth digit locks when he makes a fist.  If he flexes the PIP joint, he has to forcibly extend the digit.  This occurrs randomly and feels like a cramp when it happens.  He also reports pain in his right knee.  Sometimes he reports crepitus.  He is requesting a knee brace that he can wear when he is trying to play softball.  Lastly he has a serpiginous round rash on the dorsum of his fourth and fifth metatarsals on his left foot.  It has a sharp red serpiginous border.  Past Medical History:  Diagnosis Date   Diabetes mellitus without complication (HCC)    Hyperlipidemia    Hypertension    Kidney stones    Thyroid disease    hypothyroidism   Past Surgical History:  Procedure Laterality Date   APPENDECTOMY     HEMORRHOID SURGERY     Current Outpatient Medications on File Prior to Visit  Medication Sig Dispense Refill   amLODipine (NORVASC) 5 MG tablet TAKE 1 TABLET BY MOUTH EVERY DAY 90 tablet 0   aspirin EC 81 MG tablet Take 81 mg by mouth daily.     atorvastatin (LIPITOR) 40 MG tablet Take 1 tablet (40 mg total) by mouth daily. 90 tablet 1   benazepril (LOTENSIN) 40 MG tablet TAKE 1 TABLET BY MOUTH EVERY DAY 90 tablet 1   diclofenac (VOLTAREN) 75 MG EC tablet TAKE 1 TABLET BY MOUTH TWICE A DAY 90 tablet 0   diclofenac Sodium (VOLTAREN) 1 % GEL Apply 2 g topically 4 (four) times daily. 100 g 2   levothyroxine (SYNTHROID) 137 MCG tablet TAKE 1 TABLET (137 MCG TOTAL) BY MOUTH DAILY BEFORE BREAKFAST. 90 tablet 0   meclizine (ANTIVERT) 25 MG tablet TAKE 1 TABLET (25 MG TOTAL) BY MOUTH 3 (THREE) TIMES DAILY AS NEEDED FOR DIZZINESS. 30 tablet 0    metFORMIN (GLUCOPHAGE) 1000 MG tablet TAKE 1 TABLET (1,000 MG TOTAL) BY MOUTH 2 (TWO) TIMES DAILY WITH A MEAL. 180 tablet 0   pioglitazone (ACTOS) 30 MG tablet TAKE 1 TABLET BY MOUTH EVERY DAY 90 tablet 1   VIAGRA 100 MG tablet TAKE 1/2 TO 1 TABLET BY MOUTH EVERY DAY AS NEEDED FOR ERECTILE DYSFUNCTION 5 tablet 0   No current facility-administered medications on file prior to visit.   No Known Allergies Social History   Socioeconomic History   Marital status: Divorced    Spouse name: Not on file   Number of children: 1   Years of education: Not on file   Highest education level: Not on file  Occupational History   Not on file  Tobacco Use   Smoking status: Former   Smokeless tobacco: Former  Substance and Sexual Activity   Alcohol use: No   Drug use: No   Sexual activity: Not on file    Comment: separated from wife,   Other Topics Concern   Not on file  Social History Narrative   2 daughters and 1 son   4 grandchildren   3 great grandchildren   Social Determinants of Dispensing optician  Resource Strain: Low Risk    Difficulty of Paying Living Expenses: Not hard at all  Food Insecurity: No Food Insecurity   Worried About Programme researcher, broadcasting/film/videounning Out of Food in the Last Year: Never true   Ran Out of Food in the Last Year: Never true  Transportation Needs: No Transportation Needs   Lack of Transportation (Medical): No   Lack of Transportation (Non-Medical): No  Physical Activity: Insufficiently Active   Days of Exercise per Week: 3 days   Minutes of Exercise per Session: 30 min  Stress: No Stress Concern Present   Feeling of Stress : Not at all  Social Connections: Socially Integrated   Frequency of Communication with Friends and Family: More than three times a week   Frequency of Social Gatherings with Friends and Family: More than three times a week   Attends Religious Services: More than 4 times per year   Active Member of [employer] or Organizations: Yes   Attends Hospital doctorClub or Organization  Meetings: More than 4 times per year   Marital Status: Married  Catering managerntimate Partner Violence: Not At Risk   Fear of Current or Ex-Partner: No   Emotionally Abused: No   Physically Abused: No   Sexually Abused: No     Review of Systems  All other systems reviewed and are negative.     Objective:   Physical Exam Vitals reviewed.  Constitutional:      General: He is not in acute distress.    Appearance: Normal appearance. He is well-developed and normal weight. He is not diaphoretic.  HENT:     Head: Normocephalic and atraumatic.  Neck:     Thyroid: No thyromegaly.     Vascular: No JVD.  Cardiovascular:     Rate and Rhythm: Normal rate and regular rhythm.     Heart sounds: Normal heart sounds. No murmur heard. Pulmonary:     Effort: Pulmonary effort is normal. No respiratory distress.     Breath sounds: Normal breath sounds. No wheezing or rales.  Musculoskeletal:     Right hand: Tenderness and bony tenderness present. Decreased range of motion.     Cervical back: Neck supple.     Left knee: Crepitus present. No bony tenderness. Normal range of motion. No tenderness.       Feet:  Lymphadenopathy:     Cervical: No cervical adenopathy.  Neurological:     Mental Status: He is alert and oriented to person, place, and time.     Cranial Nerves: No cranial nerve deficit.     Motor: No abnormal muscle tone.     Coordination: Coordination normal.     Deep Tendon Reflexes: Reflexes are normal and symmetric.          Assessment & Plan:  Diabetes mellitus without complication (HCC) - Plan: CBC with Differential/Platelet, Lipid panel, Microalbumin, urine, COMPLETE METABOLIC PANEL WITH GFR, Hemoglobin A1c  Essential hypertension  Hypothyroidism, unspecified type - Plan: TSH  Blood pressure is too high today.  Check fasting lab work.  Based on his renal function and potassium at home starting hydrochlorothiazide 12.5 daily recheck blood pressure in 1 month.  Checking lab work  also check TSH.  Check hemoglobin A1c to monitor [place].  Goal hemoglobin A1c is less than six-point.  He has a trigger finger in his right fourth digit.  Offered the patient a cortisone injection at the inflammatory nodule on the palmar surface for the next week.  Patient declines this today.  I recommended that he wear  a hinged knee brace playing softball.  Lastly I recommended trying Lotrimin cream twice daily for 10 days to treat ringworm

## 2022-01-24 LAB — CBC WITH DIFFERENTIAL/PLATELET
Absolute Monocytes: 539 cells/uL (ref 200–950)
Basophils Absolute: 37 cells/uL (ref 0–200)
Basophils Relative: 0.6 %
Eosinophils Absolute: 298 cells/uL (ref 15–500)
Eosinophils Relative: 4.8 %
HCT: 41.1 % (ref 38.5–50.0)
Hemoglobin: 13.9 g/dL (ref 13.2–17.1)
Lymphs Abs: 1445 cells/uL (ref 850–3900)
MCH: 30.2 pg (ref 27.0–33.0)
MCHC: 33.8 g/dL (ref 32.0–36.0)
MCV: 89.3 fL (ref 80.0–100.0)
MPV: 11.7 fL (ref 7.5–12.5)
Monocytes Relative: 8.7 %
Neutro Abs: 3881 cells/uL (ref 1500–7800)
Neutrophils Relative %: 62.6 %
Platelets: 157 10*3/uL (ref 140–400)
RBC: 4.6 10*6/uL (ref 4.20–5.80)
RDW: 12.8 % (ref 11.0–15.0)
Total Lymphocyte: 23.3 %
WBC: 6.2 10*3/uL (ref 3.8–10.8)

## 2022-01-24 LAB — COMPLETE METABOLIC PANEL WITH GFR
AG Ratio: 1.6 (calc) (ref 1.0–2.5)
ALT: 15 U/L (ref 9–46)
AST: 22 U/L (ref 10–35)
Albumin: 4.7 g/dL (ref 3.6–5.1)
Alkaline phosphatase (APISO): 103 U/L (ref 35–144)
BUN: 20 mg/dL (ref 7–25)
CO2: 22 mmol/L (ref 20–32)
Calcium: 9.7 mg/dL (ref 8.6–10.3)
Chloride: 103 mmol/L (ref 98–110)
Creat: 1.24 mg/dL (ref 0.70–1.28)
Globulin: 2.9 g/dL (calc) (ref 1.9–3.7)
Glucose, Bld: 92 mg/dL (ref 65–99)
Potassium: 4.4 mmol/L (ref 3.5–5.3)
Sodium: 140 mmol/L (ref 135–146)
Total Bilirubin: 0.9 mg/dL (ref 0.2–1.2)
Total Protein: 7.6 g/dL (ref 6.1–8.1)
eGFR: 60 mL/min/{1.73_m2} (ref >=60)

## 2022-01-24 LAB — TSH: TSH: 1.39 mIU/L (ref 0.40–4.50)

## 2022-01-24 LAB — HEMOGLOBIN A1C
Hgb A1c MFr Bld: 6.5 % of total Hgb — ABNORMAL HIGH (ref <5.7)
Mean Plasma Glucose: 140 mg/dL
eAG (mmol/L): 7.7 mmol/L

## 2022-01-24 LAB — LIPID PANEL
Cholesterol: 144 mg/dL (ref <200)
HDL: 41 mg/dL (ref >=40)
LDL Cholesterol (Calc): 74 mg/dL (calc)
Non-HDL Cholesterol (Calc): 103 mg/dL (calc) (ref <130)
Total CHOL/HDL Ratio: 3.5 (calc) (ref <5.0)
Triglycerides: 203 mg/dL — ABNORMAL HIGH (ref <150)

## 2022-01-24 LAB — MICROALBUMIN, URINE: Microalb, Ur: 1.1 mg/dL

## 2022-03-05 ENCOUNTER — Other Ambulatory Visit: Payer: Self-pay | Admitting: Family Medicine

## 2022-04-28 ENCOUNTER — Telehealth: Payer: Self-pay

## 2022-04-28 MED ORDER — LEVOTHYROXINE SODIUM 137 MCG PO TABS
ORAL_TABLET | ORAL | 3 refills | Status: DC
Start: 2022-04-28 — End: 2023-07-02

## 2022-04-28 NOTE — Telephone Encounter (Signed)
Pharmacy faxed a refill request for levothyroxine (SYNTHROID) 137 MCG tablet [[id number]]    Order Details Dose, Route, Frequency: As Directed  Dispense Quantity: 90 tablet Refills: 0        Sig: TAKE 1 TABLET (137 MCG TOTAL) BY MOUTH DAILY BEFORE BREAKFAST.       Start Date: 12/27/21 End Date: --  Written Date: 12/27/21 Expiration Date: 12/27/22

## 2022-05-02 ENCOUNTER — Telehealth: Payer: Self-pay | Admitting: Family Medicine

## 2022-05-02 MED ORDER — ATORVASTATIN CALCIUM 40 MG PO TABS: 40.0000 mg | ORAL_TABLET | Freq: Every day | ORAL | 1 refills | Status: DC

## 2022-05-02 NOTE — Telephone Encounter (Signed)
Received eFax from pharmacy to request refill of   atorvastatin (LIPITOR) 40 MG tablet   Pharmacy info:  [facility] [address] [place] - [address]  [address] [place], [place] [address]  Phone:  [phone]  Fax:  [phone]  DEA #:  [id number]  LOV:  01/23/2022  Please advise pharmacist at [phone].

## 2022-05-06 ENCOUNTER — Other Ambulatory Visit: Payer: Self-pay

## 2022-05-06 NOTE — Telephone Encounter (Signed)
Pharmacy faxed a refill request for benazepril (LOTENSIN) 40 MG tablet [[id number]]    Order Details Dose, Route, Frequency: As Directed  Dispense Quantity: 90 tablet Refills: 1        Sig: TAKE 1 TABLET BY MOUTH EVERY DAY       Start Date: 09/03/21 End Date: --  Written Date: 09/03/21 Expiration Date: 09/03/22  Original Order:  benazepril (LOTENSIN) 40 MG tablet [[id number]]

## 2022-05-07 MED ORDER — BENAZEPRIL HCL 40 MG PO TABS: 40.0000 mg | ORAL_TABLET | Freq: Every day | ORAL | 0 refills | Status: DC

## 2022-05-07 NOTE — Telephone Encounter (Signed)
Requested Prescriptions  Pending Prescriptions Disp Refills  . benazepril (LOTENSIN) 40 MG tablet 90 tablet 0    Sig: Take 1 tablet (40 mg total) by mouth daily.     Cardiovascular:  ACE Inhibitors Failed - 05/06/2022  5:12 PM      Failed - Last BP in normal range    BP Readings from Last 1 Encounters:  01/23/22 (!) 158/78         Failed - Valid encounter within last 6 months    Recent Outpatient Visits          3 months ago Diabetes mellitus without complication (HCC)   [facility] Pickard, Priscille Heidelberg, MD   9 months ago Need for immunization against influenza   [facility] Tanya Nones, Priscille Heidelberg, MD   1 year ago Diabetes mellitus without complication (HCC)   [facility] Donita Brooks, MD   2 years ago BPPV (benign paroxysmal positional vertigo), unspecified laterality   [facility] Donita Brooks, MD   2 years ago Diabetes mellitus without complication [facility])   [facility] Pickard, Priscille Heidelberg, MD             Passed - Cr in normal range and within 180 days    Creat  Date Value Ref Range Status  01/23/2022 1.24 0.70 - 1.28 mg/dL Final   Creatinine, Urine  Date Value Ref Range Status  09/23/2019 167 20 - 320 mg/dL Final         Passed - K in normal range and within 180 days    Potassium  Date Value Ref Range Status  01/23/2022 4.4 3.5 - 5.3 mmol/L Final         Passed - Patient is not pregnant

## 2022-06-10 ENCOUNTER — Ambulatory Visit (INDEPENDENT_AMBULATORY_CARE_PROVIDER_SITE_OTHER): Payer: Medicare PPO | Admitting: Family Medicine

## 2022-06-10 VITALS — BP 136/88 | HR 62 | Wt 210.0 lb

## 2022-06-10 DIAGNOSIS — M7989 Other specified soft tissue disorders: Secondary | ICD-10-CM

## 2022-06-10 NOTE — Progress Notes (Signed)
Subjective:    Patient ID: [name], male    DOB: [birth year], 76 y.o.   MRN: [id number]  Patient presents today with swelling in his left leg distal to his knee.  There is no erythema.  There is no pain.  He has negative Homans' sign.  However he does have +1 edema over his anterior left shin and left ankle.  He is currently on Actos as well as amlodipine which can cause swelling.  He denies any recent immobilization.  He denies any recent surgery or injury.  He denies any leg pain.  He has been wearing a brace on his left knee and has been wearing it extremely tight.  I believe that that has contributed to the edema in his lower left leg likely due to venous compression Past Medical History:  Diagnosis Date   Diabetes mellitus without complication ([place])    Hyperlipidemia    Hypertension    Kidney stones    Thyroid disease    hypothyroidism   Past Surgical History:  Procedure Laterality Date   APPENDECTOMY     HEMORRHOID SURGERY     Current Outpatient Medications on File Prior to Visit  Medication Sig Dispense Refill   amLODipine (NORVASC) 5 MG tablet TAKE 1 TABLET BY MOUTH EVERY DAY 90 tablet 0   aspirin EC 81 MG tablet Take 81 mg by mouth daily.     atorvastatin (LIPITOR) 40 MG tablet Take 1 tablet (40 mg total) by mouth daily. 90 tablet 1   benazepril (LOTENSIN) 40 MG tablet Take 1 tablet (40 mg total) by mouth daily. 90 tablet 0   diclofenac (VOLTAREN) 75 MG EC tablet TAKE 1 TABLET BY MOUTH TWICE A DAY 90 tablet 0   diclofenac Sodium (VOLTAREN) 1 % GEL Apply 2 g topically 4 (four) times daily. 100 g 2   levothyroxine (SYNTHROID) 137 MCG tablet TAKE 1 TABLET (137 MCG TOTAL) BY MOUTH DAILY BEFORE BREAKFAST. 90 tablet 3   meclizine (ANTIVERT) 25 MG tablet TAKE 1 TABLET (25 MG TOTAL) BY MOUTH 3 (THREE) TIMES DAILY AS NEEDED FOR DIZZINESS. 30 tablet 0   metFORMIN (GLUCOPHAGE) 1000 MG tablet TAKE 1 TABLET (1,000 MG TOTAL) BY MOUTH 2 (TWO) TIMES DAILY WITH A MEAL. 180 tablet 0    pioglitazone (ACTOS) 30 MG tablet TAKE 1 TABLET BY MOUTH EVERY DAY 90 tablet 1   VIAGRA 100 MG tablet TAKE 1/2 TO 1 TABLET BY MOUTH EVERY DAY AS NEEDED FOR ERECTILE DYSFUNCTION (Patient not taking: Reported on 06/10/2022) 5 tablet 0   No current facility-administered medications on file prior to visit.   No Known Allergies Social History   Socioeconomic History   Marital status: Divorced    Spouse name: Not on file   Number of children: 1   Years of education: Not on file   Highest education level: Not on file  Occupational History   Not on file  Tobacco Use   Smoking status: Former   Smokeless tobacco: Former  Substance and Sexual Activity   Alcohol use: No   Drug use: No   Sexual activity: Not on file    Comment: separated from wife,   Other Topics Concern   Not on file  Social History Narrative   2 daughters and 1 son   4 grandchildren   3 great grandchildren   Social Determinants of Health   Financial Resource Strain: Low Risk  (07/19/2021)   Overall Financial Resource Strain (CARDIA)    Difficulty of  Paying Living Expenses: Not hard at all  Food Insecurity: No Food Insecurity (07/19/2021)   Hunger Vital Sign    Worried About Running Out of Food in the Last Year: Never true    Ran Out of Food in the Last Year: Never true  Transportation Needs: No Transportation Needs (07/19/2021)   PRAPARE - Hydrologist (Medical): No    Lack of Transportation (Non-Medical): No  Physical Activity: Insufficiently Active (07/19/2021)   Exercise Vital Sign    Days of Exercise per Week: 3 days    Minutes of Exercise per Session: 30 min  Stress: No Stress Concern Present (07/19/2021)   [place]    Feeling of Stress : Not at all  Social Connections: [place] (07/19/2021)   Social Connection and Isolation Panel [NHANES]    Frequency of Communication with Friends and Family: More  than three times a week    Frequency of Social Gatherings with Friends and Family: More than three times a week    Attends Religious Services: More than 4 times per year    Active Member of [employer] or Organizations: Yes    Attends Archivist Meetings: More than 4 times per year    Marital Status: Married  Human resources officer Violence: Not At Risk (07/19/2021)   Humiliation, Afraid, Rape, and Kick questionnaire    Fear of Current or Ex-Partner: No    Emotionally Abused: No    Physically Abused: No    Sexually Abused: No     Review of Systems  All other systems reviewed and are negative.      Objective:   Physical Exam Vitals reviewed.  Constitutional:      General: He is not in acute distress.    Appearance: Normal appearance. He is well-developed and normal weight. He is not diaphoretic.  HENT:     Head: Normocephalic and atraumatic.  Neck:     Thyroid: No thyromegaly.  Cardiovascular:     Rate and Rhythm: Normal rate and regular rhythm.     Heart sounds: Normal heart sounds. No murmur heard. Pulmonary:     Effort: Pulmonary effort is normal. No respiratory distress.     Breath sounds: Normal breath sounds. No wheezing or rales.  Musculoskeletal:     Cervical back: Neck supple.     Left knee: Crepitus present. No bony tenderness. Normal range of motion. No tenderness.     Left lower leg: Edema present.  Lymphadenopathy:     Cervical: No cervical adenopathy.  Neurological:     Mental Status: He is alert and oriented to person, place, and time.     Cranial Nerves: No cranial nerve deficit.     Motor: No abnormal muscle tone.     Coordination: Coordination normal.     Deep Tendon Reflexes: Reflexes are normal and symmetric.           Assessment & Plan:  Leg swelling - Plan: D-dimer, quantitative I do not believe that the patient has a DVT.  I believe the patient likely has swelling due to a combination of Actos and amlodipine and wearing a tight knee brace  on his left knee compromising venous return to the heart.  I will check a D-dimer and if negative I do not feel any additional imaging or work-up is necessary.

## 2022-06-11 LAB — D-DIMER, QUANTITATIVE: D-Dimer, Quant: 0.79 mcg/mL FEU — ABNORMAL HIGH (ref <0.50)

## 2022-06-12 ENCOUNTER — Telehealth: Payer: Self-pay

## 2022-06-12 ENCOUNTER — Other Ambulatory Visit: Payer: Self-pay | Admitting: Family Medicine

## 2022-06-12 DIAGNOSIS — R7989 Other specified abnormal findings of blood chemistry: Secondary | ICD-10-CM

## 2022-06-12 DIAGNOSIS — M7989 Other specified soft tissue disorders: Secondary | ICD-10-CM

## 2022-06-12 NOTE — Telephone Encounter (Signed)
Tried calling pt this morning, 06/12/22 LVM @ 9am.   06/12/22 @ 3:12pm, Tried calling pt again, left msg for pt stating that is important he calls back. Will try again before leave work today.

## 2022-06-17 ENCOUNTER — Ambulatory Visit
Admission: RE | Admit: 2022-06-17 | Discharge: 2022-06-17 | Disposition: A | Discharge status: Home / Self Care | Payer: Medicare PPO | Source: Ambulatory Visit | Attending: Family Medicine | Admitting: Family Medicine

## 2022-06-17 DIAGNOSIS — M7989 Other specified soft tissue disorders: Secondary | ICD-10-CM

## 2022-06-17 DIAGNOSIS — R600 Localized edema: Secondary | ICD-10-CM | POA: Diagnosis not present

## 2022-06-17 DIAGNOSIS — R7989 Other specified abnormal findings of blood chemistry: Secondary | ICD-10-CM

## 2022-07-01 ENCOUNTER — Other Ambulatory Visit: Payer: Self-pay

## 2022-07-01 MED ORDER — METFORMIN HCL 1000 MG PO TABS: 1000.0000 mg | ORAL_TABLET | Freq: Two times a day (BID) | ORAL | 1 refills | Status: DC

## 2022-07-01 NOTE — Telephone Encounter (Signed)
  Prescription Request  07/01/2022  Is this a "Controlled Substance" medicine? No  LOV: 06/12/2022   What is the name of the medication or equipment? metFORMIN (GLUCOPHAGE) 1000    Have you contacted your pharmacy to request a refill? Yes   Which pharmacy would you like this sent to?  [id number] [place], [place] - [address] [address] Phone: [phone] Fax: [phone]   Patient notified that their request is being sent to the clinical staff for review and that they should receive a response within 2 business days.   Please advise at [phone] (mobile)

## 2022-07-01 NOTE — Telephone Encounter (Signed)
Requested Prescriptions  Pending Prescriptions Disp Refills  . metFORMIN (GLUCOPHAGE) 1000 MG tablet 180 tablet 1    Sig: Take 1 tablet (1,000 mg total) by mouth 2 (two) times daily with a meal.     Endocrinology:  Diabetes - Biguanides Failed - 07/01/2022  3:51 PM      Failed - B12 Level in normal range and within 720 days    No results found for: "VITAMINB12"       Failed - Valid encounter within last 6 months    Recent Outpatient Visits          5 months ago Diabetes mellitus without complication ([place])   [place] Pickard, Cammie Mcgee, MD   11 months ago Need for immunization against influenza   [place] Pickard, Cammie Mcgee, MD   1 year ago Diabetes mellitus without complication ([place])   [place] Susy Frizzle, MD   2 years ago BPPV (benign paroxysmal positional vertigo), unspecified laterality   [place] Susy Frizzle, MD   2 years ago Diabetes mellitus without complication ([place])   [place] Pickard, Cammie Mcgee, MD             Passed - Cr in normal range and within 360 days    Creat  Date Value Ref Range Status  01/23/2022 1.24 0.70 - 1.28 mg/dL Final   Creatinine, Urine  Date Value Ref Range Status  09/23/2019 167 20 - 320 mg/dL Final         Passed - HBA1C is between 0 and 7.9 and within 180 days    Hgb A1c MFr Bld  Date Value Ref Range Status  01/23/2022 6.5 (H) <5.7 % of total Hgb Final    Comment:    For someone without known diabetes, a hemoglobin A1c value of 6.5% or greater indicates that they may have  diabetes and this should be confirmed with a follow-up  test. . For someone with known diabetes, a value <7% indicates  that their diabetes is well controlled and a value  greater than or equal to 7% indicates suboptimal  control. A1c targets should be individualized based on  duration of diabetes, age, comorbid conditions, and  other  considerations. . Currently, no consensus exists regarding use of hemoglobin A1c for diagnosis of diabetes for children. .          Passed - eGFR in normal range and within 360 days    GFR, Est African American  Date Value Ref Range Status  07/13/2018 69 > OR = 60 mL/min/1.74m Final   GFR, Est Non African American  Date Value Ref Range Status  07/13/2018 60 > OR = 60 mL/min/1.748mFinal   eGFR  Date Value Ref Range Status  01/23/2022 60 > OR = 60 mL/min/1.7332minal    Comment:    The eGFR is based on the CKD-EPI 2021 equation. To calculate  the new eGFR from a previous Creatinine or Cystatin C result, go to https://www.kidney.org/professionals/ kdoqi/gfr%5Fcalculator          Passed - CBC within normal limits and completed in the last 12 months    WBC  Date Value Ref Range Status  01/23/2022 6.2 3.8 - 10.8 Thousand/uL Final   RBC  Date Value Ref Range Status  01/23/2022 4.60 4.20 - 5.80 Million/uL Final   Hemoglobin  Date Value Ref Range Status  01/23/2022 13.9 13.2 - 17.1 g/dL Final  HCT  Date Value Ref Range Status  01/23/2022 41.1 38.5 - 50.0 % Final   MCHC  Date Value Ref Range Status  01/23/2022 33.8 32.0 - 36.0 g/dL Final   [facility]  Date Value Ref Range Status  01/23/2022 30.2 27.0 - 33.0 pg Final   MCV  Date Value Ref Range Status  01/23/2022 89.3 80.0 - 100.0 fL Final   No results found for: "PLTCOUNTKUC", "LABPLAT", "POCPLA" RDW  Date Value Ref Range Status  01/23/2022 12.8 11.0 - 15.0 % Final

## 2022-07-14 ENCOUNTER — Other Ambulatory Visit: Payer: Self-pay

## 2022-07-14 NOTE — Telephone Encounter (Signed)
  Prescription Request  07/14/2022  Is this a "Controlled Substance" medicine? No  LOV: 07/01/2022   What is the name of the medication or equipment? amLODipine (NORVASC) 5 MG tablet [[id number]]   Have you contacted your pharmacy to request a refill? Yes   Which pharmacy would you like this sent to?  [facility] [address] [place] - [address] [place] [address] Phone: [phone] Fax: [phone]   Patient notified that their request is being sent to the clinical staff for review and that they should receive a response within 2 business days.   Please advise at [phone]

## 2022-07-15 MED ORDER — AMLODIPINE BESYLATE 5 MG PO TABS: 5.0000 mg | ORAL_TABLET | Freq: Every day | ORAL | 0 refills | Status: DC

## 2022-07-15 NOTE — Telephone Encounter (Signed)
Requested Prescriptions  Pending Prescriptions Disp Refills   amLODipine (NORVASC) 5 MG tablet 90 tablet 0    Sig: Take 1 tablet (5 mg total) by mouth daily.     Cardiovascular: Calcium Channel Blockers 2 Failed - 07/14/2022  1:19 PM      Failed - Valid encounter within last 6 months    Recent Outpatient Visits           5 months ago Diabetes mellitus without complication (HCC)   [facility] Pickard, Priscille Heidelberg, MD   12 months ago Need for immunization against influenza   [facility] Tanya Nones, Priscille Heidelberg, MD   1 year ago Diabetes mellitus without complication [facility])   [facility] Donita Brooks, MD   2 years ago BPPV (benign paroxysmal positional vertigo), unspecified laterality   [facility] Donita Brooks, MD   2 years ago Diabetes mellitus without complication [facility])   [facility] Pickard, Priscille Heidelberg, MD              Passed - Last BP in normal range    BP Readings from Last 1 Encounters:  06/10/22 136/88         Passed - Last Heart Rate in normal range    Pulse Readings from Last 1 Encounters:  06/10/22 62

## 2022-07-28 DIAGNOSIS — M1712 Unilateral primary osteoarthritis, left knee: Secondary | ICD-10-CM | POA: Diagnosis not present

## 2022-07-28 DIAGNOSIS — M25562 Pain in left knee: Secondary | ICD-10-CM | POA: Diagnosis not present

## 2022-08-01 ENCOUNTER — Telehealth: Payer: Self-pay

## 2022-08-01 NOTE — Telephone Encounter (Signed)
Attempted to call pt re AWV, LVM for pt to return called.

## 2022-08-04 ENCOUNTER — Telehealth: Payer: Self-pay | Admitting: Family Medicine

## 2022-08-04 NOTE — Telephone Encounter (Signed)
Left message for patient to call back and schedule Medicare Annual Wellness Visit (AWV) in office.   If not able to come in office, please offer to do virtually or by telephone.   Last AWV:07/19/2021   Please schedule at anytime with [facility]-Nurse Health Advisor Courtney  If any questions, please contact me at [phone].  Thank you ,  Stephanie 

## 2022-09-05 ENCOUNTER — Other Ambulatory Visit: Payer: Self-pay

## 2022-09-05 DIAGNOSIS — E119 Type 2 diabetes mellitus without complications: Secondary | ICD-10-CM

## 2022-09-05 DIAGNOSIS — I10 Essential (primary) hypertension: Secondary | ICD-10-CM

## 2022-09-05 MED ORDER — PIOGLITAZONE HCL 30 MG PO TABS: 30.0000 mg | ORAL_TABLET | Freq: Every day | ORAL | 3 refills | Status: DC

## 2022-09-05 MED ORDER — BENAZEPRIL HCL 40 MG PO TABS: 40.0000 mg | ORAL_TABLET | Freq: Every day | ORAL | 3 refills | Status: DC

## 2022-09-05 MED ORDER — PIOGLITAZONE HCL 30 MG PO TABS: 30.0000 mg | ORAL_TABLET | Freq: Every day | ORAL | 0 refills | Status: DC

## 2022-09-05 NOTE — Telephone Encounter (Signed)
Prescription Request  09/05/2022  Is this a "Controlled Substance" medicine? Yes  LOV: 08/01/22  What is the name of the medication or equipment? benazepril (LOTENSIN) 40 MG  Have you contacted your pharmacy to request a refill? Yes   Which pharmacy would you like this sent to?  [id number] [place], [place] - [address] [address] Phone: [phone] Fax: [phone]    Patient notified that their request is being sent to the clinical staff for review and that they should receive a response within 2 business days.   Please advise at [facility] [phone]

## 2022-09-05 NOTE — Telephone Encounter (Signed)
Requested Prescriptions  Pending Prescriptions Disp Refills   pioglitazone (ACTOS) 30 MG tablet 90 tablet 0    Sig: Take 1 tablet (30 mg total) by mouth daily.     Endocrinology:  Diabetes - Glitazones - pioglitazone Failed - 09/05/2022  9:18 AM      Failed - HBA1C is between 0 and 7.9 and within 180 days    Hgb A1c MFr Bld  Date Value Ref Range Status  01/23/2022 6.5 (H) <5.7 % of total Hgb Final    Comment:    For someone without known diabetes, a hemoglobin A1c value of 6.5% or greater indicates that they may have  diabetes and this should be confirmed with a follow-up  test. . For someone with known diabetes, a value <7% indicates  that their diabetes is well controlled and a value  greater than or equal to 7% indicates suboptimal  control. A1c targets should be individualized based on  duration of diabetes, age, comorbid conditions, and  other considerations. . Currently, no consensus exists regarding use of hemoglobin A1c for diagnosis of diabetes for children. .          Failed - Valid encounter within last 6 months    Recent Outpatient Visits           7 months ago Diabetes mellitus without complication ([place])   [place] Pickard, Cammie Mcgee, MD   1 year ago Need for immunization against influenza   [place] [facility] Schaumann Cammie Mcgee, MD   1 year ago Diabetes mellitus without complication [facility])   [place] Susy Frizzle, MD   2 years ago BPPV (benign paroxysmal positional vertigo), unspecified laterality   [place] Susy Frizzle, MD   2 years ago Diabetes mellitus without complication [facility])   [facility] Pickard, Cammie Mcgee, MD

## 2022-09-05 NOTE — Telephone Encounter (Signed)
Prescription Request  09/05/2022  Is this a "Controlled Substance" medicine? No  LOV: 08/01/22  What is the name of the medication or equipment? pioglitazone (ACTOS) 30 MG tablet [[id number]]  Have you contacted your pharmacy to request a refill? Yes   Which pharmacy would you like this sent to?  [id number] - [place], [place] - [address] [address] Phone: [phone] Fax: [phone]    Patient notified that their request is being sent to the clinical staff for review and that they should receive a response within 2 business days.   Please advise at [facility] [phone]

## 2022-09-09 ENCOUNTER — Ambulatory Visit (INDEPENDENT_AMBULATORY_CARE_PROVIDER_SITE_OTHER): Payer: Medicare PPO

## 2022-09-09 VITALS — Ht 73.0 in | Wt 210.0 lb

## 2022-09-09 DIAGNOSIS — Z0000 Encounter for general adult medical examination without abnormal findings: Secondary | ICD-10-CM | POA: Diagnosis not present

## 2022-09-09 NOTE — Progress Notes (Signed)
Subjective:   [name] is a 77 y.o. male who presents for Medicare Annual/Subsequent preventive examination.  I connected with  Caprice Kluver on 09/09/22 by a audio enabled telemedicine application and verified that I am speaking with the correct person using two identifiers.  Patient Location: Home  Provider Location: Home Office  I discussed the limitations of evaluation and management by telemedicine. The patient expressed understanding and agreed to proceed.  Review of Systems     Cardiac Risk Factors include: advanced age (>96men, >20 women);diabetes mellitus;male gender;hypertension     Objective:    Today's Vitals   09/09/22 1541  Weight: 210 lb (95.3 kg)  Height: 6\' 1"  (1.854 m)   Body mass index is 27.71 kg/m.     09/09/2022    3:50 PM 07/19/2021    2:20 PM  Advanced Directives  Does Patient Have a Medical Advance Directive? Yes No  Type of Advance Directive Living will   Does patient want to make changes to medical advance directive? No - Patient declined   Would patient like information on creating a medical advance directive?  No - Patient declined    Current Medications (verified) Outpatient Encounter Medications as of 09/09/2022  Medication Sig   amLODipine (NORVASC) 5 MG tablet Take 1 tablet (5 mg total) by mouth daily.   aspirin EC 81 MG tablet Take 81 mg by mouth daily.   atorvastatin (LIPITOR) 40 MG tablet Take 1 tablet (40 mg total) by mouth daily.   benazepril (LOTENSIN) 40 MG tablet Take 1 tablet (40 mg total) by mouth daily.   diclofenac (VOLTAREN) 75 MG EC tablet TAKE 1 TABLET BY MOUTH TWICE A DAY   diclofenac Sodium (VOLTAREN) 1 % GEL Apply 2 g topically 4 (four) times daily.   levothyroxine (SYNTHROID) 137 MCG tablet TAKE 1 TABLET (137 MCG TOTAL) BY MOUTH DAILY BEFORE BREAKFAST.   meclizine (ANTIVERT) 25 MG tablet TAKE 1 TABLET (25 MG TOTAL) BY MOUTH 3 (THREE) TIMES DAILY AS NEEDED FOR DIZZINESS.   metFORMIN (GLUCOPHAGE) 1000 MG  tablet Take 1 tablet (1,000 mg total) by mouth 2 (two) times daily with a meal.   pioglitazone (ACTOS) 30 MG tablet Take 1 tablet (30 mg total) by mouth daily.   [DISCONTINUED] VIAGRA 100 MG tablet TAKE 1/2 TO 1 TABLET BY MOUTH EVERY DAY AS NEEDED FOR ERECTILE DYSFUNCTION   No facility-administered encounter medications on file as of 09/09/2022.    Allergies (verified) Patient has no known allergies.   History: Past Medical History:  Diagnosis Date   Diabetes mellitus without complication (HCC)    Hyperlipidemia    Hypertension    Kidney stones    Thyroid disease    hypothyroidism   Past Surgical History:  Procedure Laterality Date   APPENDECTOMY     HEMORRHOID SURGERY     History reviewed. No pertinent family history. Social History   Socioeconomic History   Marital status: Divorced    Spouse name: Not on file   Number of children: 1   Years of education: Not on file   Highest education level: Not on file  Occupational History   Not on file  Tobacco Use   Smoking status: Former   Smokeless tobacco: Former  Substance and Sexual Activity   Alcohol use: No   Drug use: No   Sexual activity: Not on file    Comment: separated from wife,   Other Topics Concern   Not on file  Social History Narrative  2 daughters and 1 son   4 grandchildren   3 great grandchildren   Social Determinants of Health   Financial Resource Strain: Low Risk  (09/09/2022)   Overall Financial Resource Strain (CARDIA)    Difficulty of Paying Living Expenses: Not hard at all  Food Insecurity: No Food Insecurity (09/09/2022)   Hunger Vital Sign    Worried About Running Out of Food in the Last Year: Never true    Ran Out of Food in the Last Year: Never true  Transportation Needs: No Transportation Needs (09/09/2022)   PRAPARE - Administrator, Civil Service (Medical): No    Lack of Transportation (Non-Medical): No  Physical Activity: Sufficiently Active (09/09/2022)   Exercise Vital Sign     Days of Exercise per Week: 3 days    Minutes of Exercise per Session: 60 min  Stress: No Stress Concern Present (09/09/2022)   [employer] of Occupational Health - Occupational Stress Questionnaire    Feeling of Stress : Not at all  Social Connections: Socially Integrated (09/09/2022)   Social Connection and Isolation Panel [NHANES]    Frequency of Communication with Friends and Family: More than three times a week    Frequency of Social Gatherings with Friends and Family: Three times a week    Attends Religious Services: More than 4 times per year    Active Member of Clubs or Organizations: Yes    Attends Engineer, structural: More than 4 times per year    Marital Status: Married    Tobacco Counseling Counseling given: Not Answered   Clinical Intake:  Pre-visit preparation completed: Yes  Pain : No/denies pain     Diabetes: Yes CBG done?: No Did pt. bring in CBG monitor from home?: No  How often do you need to have someone help you when you read instructions, pamphlets, or other written materials from your doctor or pharmacy?: 1 - Never  Diabetic?Yes Nutrition Risk Assessment:  Has the patient had any N/V/D within the last 2 months?  No  Does the patient have any non-healing wounds?  No  Has the patient had any unintentional weight loss or weight gain?  No   Diabetes:  Is the patient diabetic?  Yes  If diabetic, was a CBG obtained today?  No  Did the patient bring in their glucometer from home?  No  How often do you monitor your CBG's? As needed .   Financial Strains and Diabetes Management:  Are you having any financial strains with the device, your supplies or your medication? No .  Does the patient want to be seen by Chronic Care Management for management of their diabetes?  No  Would the patient like to be referred to a Nutritionist or for Diabetic Management?  No   Diabetic Exams:  Diabetic Eye Exam: Completed will request notes from  provider Diabetic Foot Exam: Completed 01/23/22   Interpreter Needed?: No  Information entered by :: Kandis Fantasia LPN   Activities of Daily Living    09/09/2022    3:50 PM  In your present state of health, do you have any difficulty performing the following activities:  Hearing? 0  Vision? 0  Difficulty concentrating or making decisions? 0  Walking or climbing stairs? 0  Dressing or bathing? 0  Doing errands, shopping? 0  Preparing Food and eating ? N  Using the Toilet? N  In the past six months, have you accidently leaked urine? N  Do you have problems  with loss of bowel control? N  Managing your Medications? N  Managing your Finances? N  Housekeeping or managing your Housekeeping? N    Patient Care Team: Donita Brooks, MD as PCP - General (Family Medicine) Madison Hickman, MD as Consulting Physician (Orthopedic Surgery) Tera Partridge, PA as Physician Assistant (Physician Assistant) Pa, [facility])  Indicate any recent Medical Services you may have received from other than [facility] providers in the past year (date may be approximate).     Assessment:   This is a routine wellness examination for [name].  Hearing/Vision screen Hearing Screening - Comments:: Denies hearing difficulties   Vision Screening - Comments:: Wears rx glasses - up to date with routine eye exams with [facility]    Dietary issues and exercise activities discussed: Current Exercise Habits: Home exercise routine, Type of exercise: walking;calisthenics, Time (Minutes): 60, Frequency (Times/Week): 3, Weekly Exercise (Minutes/Week): 180, Intensity: Mild   Goals Addressed             This Visit's Progress    Exercise 3x per week (30 min per time)   On track    Increase exercise and stay in good health.      Depression Screen    09/09/2022    3:44 PM 06/10/2022    4:01 PM 07/19/2021    2:17 PM 09/27/2019    4:41 PM 09/27/2019    3:06 PM 07/13/2018    3:41 PM 07/09/2017     4:45 PM  PHQ 2/9 Scores  PHQ - 2 Score 0 0 0 0 0 0 0    Fall Risk    09/09/2022    3:44 PM 07/19/2021    2:21 PM 09/27/2019    4:41 PM 09/27/2019    3:06 PM 07/13/2018    3:41 PM  Fall Risk   Falls in the past year? 0 0 0 0 1  Number falls in past yr: 0 0 0  0  Injury with Fall? 0 0 0  0  Risk for fall due to : No Fall Risks No Fall Risks     Follow up Falls evaluation completed;Education provided;Falls prevention discussed Falls prevention discussed  Falls evaluation completed Falls evaluation completed    FALL RISK PREVENTION PERTAINING TO THE HOME:  Any stairs in or around the home? Yes  If so, are there any without handrails? No  Home free of loose throw rugs in walkways, pet beds, electrical cords, etc? Yes  Adequate lighting in your home to reduce risk of falls? Yes   ASSISTIVE DEVICES UTILIZED TO PREVENT FALLS:  Life alert? No  Use of a cane, walker or w/c? No  Grab bars in the bathroom? Yes  Shower chair or bench in shower? No  Elevated toilet seat or a handicapped toilet? Yes   TIMED UP AND GO:  Was the test performed? No . Telephonic visit   Cognitive Function:        09/09/2022    3:51 PM 07/19/2021    2:24 PM  6CIT Screen  What Year? 0 points 0 points  What month? 0 points 0 points  What time? 0 points 0 points  Count back from 20 0 points 0 points  Months in reverse 0 points 0 points  Repeat phrase 0 points 0 points  Total Score 0 points 0 points    Immunizations Immunization History  Administered Date(s) Administered   Fluad Quad(high Dose 65+) 07/18/2021   Influenza, High Dose Seasonal PF 07/09/2017,  07/13/2018   Influenza-Unspecified 06/10/2022   PFIZER(Purple Top)SARS-COV-2 Vaccination 10/24/2019, 11/14/2019, 08/21/2020   Pneumococcal Conjugate-13 07/26/2013   Pneumococcal Polysaccharide-23 07/29/2011, 12/22/2014    TDAP status: Due, Education has been provided regarding the importance of this vaccine. Advised may receive this vaccine  at local pharmacy or Health Dept. Aware to provide a copy of the vaccination record if obtained from local pharmacy or Health Dept. Verbalized acceptance and understanding.  Flu Vaccine status: Up to date  Pneumococcal vaccine status: Up to date  Covid-19 vaccine status: Information provided on how to obtain vaccines.   Qualifies for Shingles Vaccine? Yes   Zostavax completed No   Shingrix Completed?: No.    Education has been provided regarding the importance of this vaccine. Patient has been advised to call insurance company to determine out of pocket expense if they have not yet received this vaccine. Advised may also receive vaccine at local pharmacy or Health Dept. Verbalized acceptance and understanding.  Screening Tests Health Maintenance  Topic Date Due   DTaP/Tdap/Td (1 - Tdap) Never done   Diabetic kidney evaluation - Urine ACR  09/22/2020   OPHTHALMOLOGY EXAM  08/16/2021   HEMOGLOBIN A1C  07/26/2022   COVID-19 Vaccine (4 - 2023-24 season) 01/31/2023 (Originally 05/02/2022)   Zoster Vaccines- Shingrix (1 of 2) 01/31/2023 (Originally [birth year]/20/1994)   Diabetic kidney evaluation - eGFR measurement  01/24/2023   FOOT EXAM  01/24/2023   Medicare Annual Wellness (AWV)  09/10/2023   Pneumonia Vaccine 72+ Years old  Completed   INFLUENZA VACCINE  Completed   HPV VACCINES  Aged Out   Hepatitis C Screening  Discontinued    Health Maintenance  Health Maintenance Due  Topic Date Due   DTaP/Tdap/Td (1 - Tdap) Never done   Diabetic kidney evaluation - Urine ACR  09/22/2020   OPHTHALMOLOGY EXAM  08/16/2021   HEMOGLOBIN A1C  07/26/2022    Colorectal cancer screening: No longer required.   Lung Cancer Screening: (Low Dose CT Chest recommended if Age 16-80 years, 30 pack-year currently smoking OR have quit w/in 15years.) does not qualify.   Lung Cancer Screening Referral: n/a   Additional Screening:  Hepatitis C Screening: does not qualify  Vision Screening: Recommended annual  ophthalmology exams for early detection of glaucoma and other disorders of the eye. Is the patient up to date with their annual eye exam?  Yes  Who is the provider or what is the name of the office in which the patient attends annual eye exams? [place]  If pt is not established with a provider, would they like to be referred to a provider to establish care? No .   Dental Screening: Recommended annual dental exams for proper oral hygiene  Community Resource Referral / Chronic Care Management: CRR required this visit?  No   CCM required this visit?  No      Plan:     I have personally reviewed and noted the following in the patient's chart:   Medical and social history Use of alcohol, tobacco or illicit drugs  Current medications and supplements including opioid prescriptions. Patient is not currently taking opioid prescriptions. Functional ability and status Nutritional status Physical activity Advanced directives List of other physicians Hospitalizations, surgeries, and ER visits in previous 12 months Vitals Screenings to include cognitive, depression, and falls Referrals and appointments  In addition, I have reviewed and discussed with patient certain preventive protocols, quality metrics, and best practice recommendations. A written personalized care plan for preventive services as well  as general preventive health recommendations were provided to patient.     Durwin Nora, LPN   01/06/3219   Due to this being a virtual visit, the after visit summary with patients personalized plan was offered to patient via mail or my-chart.Patient preferred to pick up at office at next visit   Nurse Notes:No concerns

## 2022-09-09 NOTE — Patient Instructions (Signed)
[name] , Thank you for taking time to come for your Medicare Wellness Visit. I appreciate your ongoing commitment to your health goals. Please review the following plan we discussed and let me know if I can assist you in the future.   These are the goals we discussed:  Goals      Exercise 3x per week (30 min per time)     Increase exercise and stay in good health.        This is a list of the screening recommended for you and due dates:  Health Maintenance  Topic Date Due   DTaP/Tdap/Td vaccine (1 - Tdap) Never done   Yearly kidney health urinalysis for diabetes  09/22/2020   Eye exam for diabetics  08/16/2021   Hemoglobin A1C  07/26/2022   COVID-19 Vaccine (4 - 2023-24 season) 01/31/2023*   Zoster (Shingles) Vaccine (1 of 2) 01/31/2023*   Yearly kidney function blood test for diabetes  01/24/2023   Complete foot exam   01/24/2023   Medicare Annual Wellness Visit  09/10/2023   Pneumonia Vaccine  Completed   Flu Shot  Completed   HPV Vaccine  Aged Out   Hepatitis C Screening: USPSTF Recommendation to screen - Ages 18-79 yo.  Discontinued  *Topic was postponed. The date shown is not the original due date.    Advanced directives: Please bring a copy of your health care power of attorney and living will to the office to be added to your chart at your convenience.   Conditions/risks identified: Aim for 30 minutes of exercise or brisk walking, 6-8 glasses of water, and 5 servings of fruits and vegetables each day.   Next appointment: Follow up in one year for your annual wellness visit.   Preventive Care 65 Years and Older, Male  Preventive care refers to lifestyle choices and visits with your health care provider that can promote health and wellness. What does preventive care include? A yearly physical exam. This is also called an annual well check. Dental exams once or twice a year. Routine eye exams. Ask your health care provider how often you should have your eyes  checked. Personal lifestyle choices, including: Daily care of your teeth and gums. Regular physical activity. Eating a healthy diet. Avoiding tobacco and drug use. Limiting alcohol use. Practicing safe sex. Taking low doses of aspirin every day. Taking vitamin and mineral supplements as recommended by your health care provider. What happens during an annual well check? The services and screenings done by your health care provider during your annual well check will depend on your age, overall health, lifestyle risk factors, and family history of disease. Counseling  Your health care provider may ask you questions about your: Alcohol use. Tobacco use. Drug use. Emotional well-being. Home and relationship well-being. Sexual activity. Eating habits. History of falls. Memory and ability to understand (cognition). Work and work Statistician. Screening  You may have the following tests or measurements: Height, weight, and BMI. Blood pressure. Lipid and cholesterol levels. These may be checked every 5 years, or more frequently if you are over 22 years old. Skin check. Lung cancer screening. You may have this screening every year starting at age 10 if you have a 30-pack-year history of smoking and currently smoke or have quit within the past 15 years. Fecal occult blood test (FOBT) of the stool. You may have this test every year starting at age 61. Flexible sigmoidoscopy or colonoscopy. You may have a sigmoidoscopy every 5 years or  a colonoscopy every 10 years starting at age 69. Prostate cancer screening. Recommendations will vary depending on your family history and other risks. Hepatitis C blood test. Hepatitis B blood test. Sexually transmitted disease (STD) testing. Diabetes screening. This is done by checking your blood sugar (glucose) after you have not eaten for a while (fasting). You may have this done every 1-3 years. Abdominal aortic aneurysm (AAA) screening. You may need this  if you are a current or former smoker. Osteoporosis. You may be screened starting at age 55 if you are at high risk. Talk with your health care provider about your test results, treatment options, and if necessary, the need for more tests. Vaccines  Your health care provider may recommend certain vaccines, such as: Influenza vaccine. This is recommended every year. Tetanus, diphtheria, and acellular pertussis (Tdap, Td) vaccine. You may need a Td booster every 10 years. Zoster vaccine. You may need this after age 71. Pneumococcal 13-valent conjugate (PCV13) vaccine. One dose is recommended after age 64. Pneumococcal polysaccharide (PPSV23) vaccine. One dose is recommended after age 65. Talk to your health care provider about which screenings and vaccines you need and how often you need them. This information is not intended to replace advice given to you by your health care provider. Make sure you discuss any questions you have with your health care provider. Document Released: 09/14/2015 Document Revised: 05/07/2016 Document Reviewed: 06/19/2015 Elsevier Interactive Patient Education  2017 [place] Prevention in the Home Falls can cause injuries. They can happen to people of all ages. There are many things you can do to make your home safe and to help prevent falls. What can I do on the outside of my home? Regularly fix the edges of walkways and driveways and fix any cracks. Remove anything that might make you trip as you walk through a door, such as a raised step or threshold. Trim any bushes or trees on the path to your home. Use bright outdoor lighting. Clear any walking paths of anything that might make someone trip, such as rocks or tools. Regularly check to see if handrails are loose or broken. Make sure that both sides of any steps have handrails. Any raised decks and porches should have guardrails on the edges. Have any leaves, snow, or ice cleared regularly. Use sand  or salt on walking paths during winter. Clean up any spills in your garage right away. This includes oil or grease spills. What can I do in the bathroom? Use night lights. Install grab bars by the toilet and in the tub and shower. Do not use towel bars as grab bars. Use non-skid mats or decals in the tub or shower. If you need to sit down in the shower, use a plastic, non-slip stool. Keep the floor dry. Clean up any water that spills on the floor as soon as it happens. Remove soap buildup in the tub or shower regularly. Attach bath mats securely with double-sided non-slip rug tape. Do not have throw rugs and other things on the floor that can make you trip. What can I do in the bedroom? Use night lights. Make sure that you have a light by your bed that is easy to reach. Do not use any sheets or blankets that are too big for your bed. They should not hang down onto the floor. Have a firm chair that has side arms. You can use this for support while you get dressed. Do not have throw rugs and other  things on the floor that can make you trip. What can I do in the kitchen? Clean up any spills right away. Avoid walking on wet floors. Keep items that you use a lot in easy-to-reach places. If you need to reach something above you, use a strong step stool that has a grab bar. Keep electrical cords out of the way. Do not use floor polish or wax that makes floors slippery. If you must use wax, use non-skid floor wax. Do not have throw rugs and other things on the floor that can make you trip. What can I do with my stairs? Do not leave any items on the stairs. Make sure that there are handrails on both sides of the stairs and use them. Fix handrails that are broken or loose. Make sure that handrails are as long as the stairways. Check any carpeting to make sure that it is firmly attached to the stairs. Fix any carpet that is loose or worn. Avoid having throw rugs at the top or bottom of the stairs.  If you do have throw rugs, attach them to the floor with carpet tape. Make sure that you have a light switch at the top of the stairs and the bottom of the stairs. If you do not have them, ask someone to add them for you. What else can I do to help prevent falls? Wear shoes that: Do not have high heels. Have rubber bottoms. Are comfortable and fit you well. Are closed at the toe. Do not wear sandals. If you use a stepladder: Make sure that it is fully opened. Do not climb a closed stepladder. Make sure that both sides of the stepladder are locked into place. Ask someone to hold it for you, if possible. Clearly mark and make sure that you can see: Any grab bars or handrails. First and last steps. Where the edge of each step is. Use tools that help you move around (mobility aids) if they are needed. These include: Canes. Walkers. Scooters. Crutches. Turn on the lights when you go into a dark area. Replace any light bulbs as soon as they burn out. Set up your furniture so you have a clear path. Avoid moving your furniture around. If any of your floors are uneven, fix them. If there are any pets around you, be aware of where they are. Review your medicines with your doctor. Some medicines can make you feel dizzy. This can increase your chance of falling. Ask your doctor what other things that you can do to help prevent falls. This information is not intended to replace advice given to you by your health care provider. Make sure you discuss any questions you have with your health care provider. Document Released: 06/14/2009 Document Revised: 01/24/2016 Document Reviewed: 09/22/2014 Elsevier Interactive Patient Education  2017 [employer].

## 2022-09-18 ENCOUNTER — Ambulatory Visit (INDEPENDENT_AMBULATORY_CARE_PROVIDER_SITE_OTHER): Payer: Medicare PPO | Admitting: Family Medicine

## 2022-09-18 ENCOUNTER — Encounter: Payer: Self-pay | Admitting: Family Medicine

## 2022-09-18 VITALS — BP 120/72 | HR 62 | Temp 97.8°F | Ht 73.0 in | Wt 212.0 lb

## 2022-09-18 DIAGNOSIS — M170 Bilateral primary osteoarthritis of knee: Secondary | ICD-10-CM | POA: Diagnosis not present

## 2022-09-18 MED ORDER — DICLOFENAC SODIUM 75 MG PO TBEC: 75.0000 mg | DELAYED_RELEASE_TABLET | Freq: Two times a day (BID) | ORAL | 0 refills | Status: DC | PRN

## 2022-09-18 NOTE — Progress Notes (Signed)
Subjective:    Patient ID: [name], male    DOB: [birth year], 77 y.o.   MRN: [id number] Patient is here today reporting grinding and pain in his knees especially when he stands up from a seated position or when he starts to jog.  He saw an orthopedist who diagnosed him with arthritis in his knees.  He is not currently taking any medication for this.  He denies constant pain.  He denies any swelling.  He denies any effusions. Past Medical History:  Diagnosis Date  . Diabetes mellitus without complication ([place])   . Hyperlipidemia   . Hypertension   . Kidney stones   . Thyroid disease    hypothyroidism   Past Surgical History:  Procedure Laterality Date  . APPENDECTOMY    . HEMORRHOID SURGERY     Current Outpatient Medications on File Prior to Visit  Medication Sig Dispense Refill  . amLODipine (NORVASC) 5 MG tablet Take 1 tablet (5 mg total) by mouth daily. 90 tablet 0  . aspirin EC 81 MG tablet Take 81 mg by mouth daily.    Marland Kitchen atorvastatin (LIPITOR) 40 MG tablet Take 1 tablet (40 mg total) by mouth daily. 90 tablet 1  . benazepril (LOTENSIN) 40 MG tablet Take 1 tablet (40 mg total) by mouth daily. 90 tablet 3  . diclofenac Sodium (VOLTAREN) 1 % GEL Apply 2 g topically 4 (four) times daily. 100 g 2  . levothyroxine (SYNTHROID) 137 MCG tablet TAKE 1 TABLET (137 MCG TOTAL) BY MOUTH DAILY BEFORE BREAKFAST. 90 tablet 3  . meclizine (ANTIVERT) 25 MG tablet TAKE 1 TABLET (25 MG TOTAL) BY MOUTH 3 (THREE) TIMES DAILY AS NEEDED FOR DIZZINESS. 30 tablet 0  . metFORMIN (GLUCOPHAGE) 1000 MG tablet Take 1 tablet (1,000 mg total) by mouth 2 (two) times daily with a meal. 180 tablet 1  . pioglitazone (ACTOS) 30 MG tablet Take 1 tablet (30 mg total) by mouth daily. 90 tablet 3   No current facility-administered medications on file prior to visit.   No Known Allergies Social History   Socioeconomic History  . Marital status: Divorced    Spouse name: Not on file  . Number of children: 1   . Years of education: Not on file  . Highest education level: Not on file  Occupational History  . Not on file  Tobacco Use  . Smoking status: Former  . Smokeless tobacco: Former  Substance and Sexual Activity  . Alcohol use: No  . Drug use: No  . Sexual activity: Not on file    Comment: separated from wife,   Other Topics Concern  . Not on file  Social History Narrative   2 daughters and 1 son   4 grandchildren   3 great grandchildren   Social Determinants of Health   Financial Resource Strain: Low Risk  (09/09/2022)   Overall Financial Resource Strain (CARDIA)   . Difficulty of Paying Living Expenses: Not hard at all  Food Insecurity: No Food Insecurity (09/09/2022)   Hunger Vital Sign   . Worried About Charity fundraiser in the Last Year: Never true   . Ran Out of Food in the Last Year: Never true  Transportation Needs: No Transportation Needs (09/09/2022)   PRAPARE - Transportation   . Lack of Transportation (Medical): No   . Lack of Transportation (Non-Medical): No  Physical Activity: Sufficiently Active (09/09/2022)   Exercise Vital Sign   . Days of Exercise per Week: 3 days   .  Minutes of Exercise per Session: 60 min  Stress: No Stress Concern Present (09/09/2022)   [place]   . Feeling of Stress : Not at all  Social Connections: Socially Integrated (09/09/2022)   Social Connection and Isolation Panel [NHANES]   . Frequency of Communication with Friends and Family: More than three times a week   . Frequency of Social Gatherings with Friends and Family: Three times a week   . Attends Religious Services: More than 4 times per year   . Active Member of Clubs or Organizations: Yes   . Attends Archivist Meetings: More than 4 times per year   . Marital Status: Married  Human resources officer Violence: Not At Risk (09/09/2022)   Humiliation, Afraid, Rape, and Kick questionnaire   . Fear of Current or  Ex-Partner: No   . Emotionally Abused: No   . Physically Abused: No   . Sexually Abused: No     Review of Systems  All other systems reviewed and are negative.      Objective:   Physical Exam Vitals reviewed.  Constitutional:      General: He is not in acute distress.    Appearance: Normal appearance. He is well-developed and normal weight. He is not diaphoretic.  HENT:     Head: Normocephalic and atraumatic.  Neck:     Thyroid: No thyromegaly.  Cardiovascular:     Rate and Rhythm: Normal rate and regular rhythm.     Heart sounds: Normal heart sounds. No murmur heard. Pulmonary:     Effort: Pulmonary effort is normal. No respiratory distress.     Breath sounds: Normal breath sounds. No wheezing or rales.  Musculoskeletal:     Cervical back: Neck supple.     Right knee: Crepitus present. No bony tenderness. Normal range of motion. No tenderness.     Left knee: Crepitus present. No bony tenderness. Normal range of motion. No tenderness.  Lymphadenopathy:     Cervical: No cervical adenopathy.  Neurological:     Mental Status: He is alert and oriented to person, place, and time.     Cranial Nerves: No cranial nerve deficit.     Motor: No abnormal muscle tone.     Coordination: Coordination normal.     Deep Tendon Reflexes: Reflexes are normal and symmetric.          Assessment & Plan:  Primary osteoarthritis of both knees Recommended the patient try diclofenac 75 mg p.o. twice daily as needed knee pain.  Could get cortisone injections if the diclofenac does not help with pain and swelling and grinding.

## 2022-10-13 ENCOUNTER — Other Ambulatory Visit: Payer: Self-pay | Admitting: Family Medicine

## 2022-10-13 NOTE — Telephone Encounter (Signed)
Requested Prescriptions  Pending Prescriptions Disp Refills   amLODipine (NORVASC) 5 MG tablet [Pharmacy Med Name: AMLODIPINE BESYLATE 5 MG TAB] 90 tablet 0    Sig: TAKE 1 TABLET (5 MG TOTAL) BY MOUTH DAILY.     Cardiovascular: Calcium Channel Blockers 2 Failed - 10/13/2022  1:27 AM      Failed - Valid encounter within last 6 months    Recent Outpatient Visits           8 months ago Diabetes mellitus without complication ([place])   [place] Pickard, Cammie Mcgee, MD   1 year ago Need for immunization against influenza   [place] Susy Frizzle, MD   1 year ago Diabetes mellitus without complication [facility])   [place] Susy Frizzle, MD   2 years ago BPPV (benign paroxysmal positional vertigo), unspecified laterality   [place] Susy Frizzle, MD   3 years ago Diabetes mellitus without complication [facility])   [place] Pickard, Cammie Mcgee, MD              Passed - Last BP in normal range    BP Readings from Last 1 Encounters:  09/18/22 120/72         Passed - Last Heart Rate in normal range    Pulse Readings from Last 1 Encounters:  09/18/22 62

## 2022-11-06 ENCOUNTER — Encounter: Payer: Self-pay | Admitting: Family Medicine

## 2022-11-06 ENCOUNTER — Ambulatory Visit (INDEPENDENT_AMBULATORY_CARE_PROVIDER_SITE_OTHER): Payer: Medicare PPO | Admitting: Family Medicine

## 2022-11-06 VITALS — BP 136/82 | HR 80 | Temp 98.6°F | Ht 73.0 in | Wt 211.0 lb

## 2022-11-06 DIAGNOSIS — M5431 Sciatica, right side: Secondary | ICD-10-CM | POA: Diagnosis not present

## 2022-11-06 DIAGNOSIS — E039 Hypothyroidism, unspecified: Secondary | ICD-10-CM

## 2022-11-06 DIAGNOSIS — E119 Type 2 diabetes mellitus without complications: Secondary | ICD-10-CM

## 2022-11-06 NOTE — Progress Notes (Signed)
Subjective:    Patient ID: [name], male    DOB: 06-12-1946, 77 y.o.   MRN: [id number] Patient has recently been dealing with right posterior hip pain.  The pain originates behind the greater trochanter and the gluteus muscle.  It radiates down the posterior lateral thigh into his calf and down into his foot.  He states that when he stands and walks the pain is more intense.  He has been taking diclofenac and the pain is better today.  He denies any saddle anesthesia.  He denies any bowel or bladder incontinence.  He has full flexion extension, internal, and rotation of both hips without any pain.  He has no tenderness to palpation of the greater trochanter but he does have a positive straight leg raise on the right-hand side.  He denies any falls or injuries.  He is also due today to recheck his diabetes test.  Patient denies any chest pain shortness of breath or dyspnea on exertion.  His blood pressure today is well-controlled Past Medical History:  Diagnosis Date  . Diabetes mellitus without complication ([place])   . Hyperlipidemia   . Hypertension   . Kidney stones   . Thyroid disease    hypothyroidism   Past Surgical History:  Procedure Laterality Date  . APPENDECTOMY    . HEMORRHOID SURGERY     Current Outpatient Medications on File Prior to Visit  Medication Sig Dispense Refill  . amLODipine (NORVASC) 5 MG tablet TAKE 1 TABLET (5 MG TOTAL) BY MOUTH DAILY. 90 tablet 0  . aspirin EC 81 MG tablet Take 81 mg by mouth daily.    Marland Kitchen atorvastatin (LIPITOR) 40 MG tablet Take 1 tablet (40 mg total) by mouth daily. 90 tablet 1  . benazepril (LOTENSIN) 40 MG tablet Take 1 tablet (40 mg total) by mouth daily. 90 tablet 3  . diclofenac (VOLTAREN) 75 MG EC tablet Take 1 tablet (75 mg total) by mouth 2 (two) times daily as needed for moderate pain. TAKE 1 TABLET BY MOUTH TWICE A DAY 90 tablet 0  . diclofenac Sodium (VOLTAREN) 1 % GEL Apply 2 g topically 4 (four) times daily. 100 g 2  .  levothyroxine (SYNTHROID) 137 MCG tablet TAKE 1 TABLET (137 MCG TOTAL) BY MOUTH DAILY BEFORE BREAKFAST. 90 tablet 3  . meclizine (ANTIVERT) 25 MG tablet TAKE 1 TABLET (25 MG TOTAL) BY MOUTH 3 (THREE) TIMES DAILY AS NEEDED FOR DIZZINESS. 30 tablet 0  . metFORMIN (GLUCOPHAGE) 1000 MG tablet Take 1 tablet (1,000 mg total) by mouth 2 (two) times daily with a meal. 180 tablet 1  . pioglitazone (ACTOS) 30 MG tablet Take 1 tablet (30 mg total) by mouth daily. 90 tablet 3   No current facility-administered medications on file prior to visit.   No Known Allergies Social History   Socioeconomic History  . Marital status: Divorced    Spouse name: Not on file  . Number of children: 1  . Years of education: Not on file  . Highest education level: Not on file  Occupational History  . Not on file  Tobacco Use  . Smoking status: Former  . Smokeless tobacco: Former  Substance and Sexual Activity  . Alcohol use: No  . Drug use: No  . Sexual activity: Not on file    Comment: separated from wife,   Other Topics Concern  . Not on file  Social History Narrative   2 daughters and 1 son   4 grandchildren  3 great grandchildren   Social Determinants of Health   Financial Resource Strain: Low Risk  (09/09/2022)   Overall Financial Resource Strain (CARDIA)   . Difficulty of Paying Living Expenses: Not hard at all  Food Insecurity: No Food Insecurity (09/09/2022)   Hunger Vital Sign   . Worried About Charity fundraiser in the Last Year: Never true   . Ran Out of Food in the Last Year: Never true  Transportation Needs: No Transportation Needs (09/09/2022)   PRAPARE - Transportation   . Lack of Transportation (Medical): No   . Lack of Transportation (Non-Medical): No  Physical Activity: Sufficiently Active (09/09/2022)   Exercise Vital Sign   . Days of Exercise per Week: 3 days   . Minutes of Exercise per Session: 60 min  Stress: No Stress Concern Present (09/09/2022)   [place]   . Feeling of Stress : Not at all  Social Connections: Socially Integrated (09/09/2022)   Social Connection and Isolation Panel [NHANES]   . Frequency of Communication with Friends and Family: More than three times a week   . Frequency of Social Gatherings with Friends and Family: Three times a week   . Attends Religious Services: More than 4 times per year   . Active Member of Clubs or Organizations: Yes   . Attends Archivist Meetings: More than 4 times per year   . Marital Status: Married  Human resources officer Violence: Not At Risk (09/09/2022)   Humiliation, Afraid, Rape, and Kick questionnaire   . Fear of Current or Ex-Partner: No   . Emotionally Abused: No   . Physically Abused: No   . Sexually Abused: No     Review of Systems  All other systems reviewed and are negative.      Objective:   Physical Exam Vitals reviewed.  Constitutional:      General: He is not in acute distress.    Appearance: Normal appearance. He is well-developed and normal weight. He is not diaphoretic.  HENT:     Head: Normocephalic and atraumatic.  Neck:     Thyroid: No thyromegaly.  Cardiovascular:     Rate and Rhythm: Normal rate and regular rhythm.     Heart sounds: Normal heart sounds. No murmur heard. Pulmonary:     Effort: Pulmonary effort is normal. No respiratory distress.     Breath sounds: Normal breath sounds. No wheezing or rales.  Musculoskeletal:     Cervical back: Neck supple.     Right hip: No tenderness, bony tenderness or crepitus. Normal range of motion.     Left hip: No tenderness, bony tenderness or crepitus. Normal range of motion.     Left knee: Crepitus present. No bony tenderness. Normal range of motion. No tenderness.       Legs:  Lymphadenopathy:     Cervical: No cervical adenopathy.  Neurological:     Mental Status: He is alert and oriented to person, place, and time.     Cranial Nerves: No cranial nerve deficit.      Motor: No abnormal muscle tone.     Coordination: Coordination normal.     Deep Tendon Reflexes: Reflexes are normal and symmetric.          Assessment & Plan:  Hypothyroidism, unspecified type - Plan: TSH  Diabetes mellitus without complication ([place]) - Plan: Hemoglobin A1c, COMPLETE METABOLIC PANEL WITH GFR, Protein / Creatinine Ratio, Urine, Lipid panel  Right sided  sciatica - Plan: DG Lumbar Spine Complete Patient's pain sounds like sciatica.  Begin by obtaining an x-ray of the lumbar spine.  He can use diclofenac 75 mg twice daily for pain.  As long as pain is improving and x-ray is normal, no further workup is necessary.  Blood pressure today is outstanding.  Check a TSH to ensure adequate dosage of his levothyroxine.  Check hemoglobin A1c.  Goal A1c is less than 6.5.  Check a fasting lipid panel as well as urine protein creatinine ratio.

## 2022-11-07 LAB — COMPLETE METABOLIC PANEL WITH GFR
AG Ratio: 1.8 (calc) (ref 1.0–2.5)
ALT: 20 U/L (ref 9–46)
AST: 20 U/L (ref 10–35)
Albumin: 4.6 g/dL (ref 3.6–5.1)
Alkaline phosphatase (APISO): 108 U/L (ref 35–144)
BUN/Creatinine Ratio: 21 (calc) (ref 6–22)
BUN: 26 mg/dL — ABNORMAL HIGH (ref 7–25)
CO2: 25 mmol/L (ref 20–32)
Calcium: 9.6 mg/dL (ref 8.6–10.3)
Chloride: 105 mmol/L (ref 98–110)
Creat: 1.25 mg/dL (ref 0.70–1.28)
Globulin: 2.6 g/dL (calc) (ref 1.9–3.7)
Glucose, Bld: 99 mg/dL (ref 65–99)
Potassium: 4.4 mmol/L (ref 3.5–5.3)
Sodium: 141 mmol/L (ref 135–146)
Total Bilirubin: 0.7 mg/dL (ref 0.2–1.2)
Total Protein: 7.2 g/dL (ref 6.1–8.1)
eGFR: 59 mL/min/{1.73_m2} — ABNORMAL LOW (ref >=60)

## 2022-11-07 LAB — PROTEIN / CREATININE RATIO, URINE
Creatinine, Urine: 143 mg/dL (ref 20–320)
Protein/Creat Ratio: 63 mg/g creat (ref 25–148)
Protein/Creatinine Ratio: 0.063 mg/mg creat (ref 0.025–0.148)
Total Protein, Urine: 9 mg/dL (ref 5–25)

## 2022-11-07 LAB — LIPID PANEL
Cholesterol: 137 mg/dL (ref <200)
HDL: 38 mg/dL — ABNORMAL LOW (ref >=40)
LDL Cholesterol (Calc): 71 mg/dL (calc)
Non-HDL Cholesterol (Calc): 99 mg/dL (calc) (ref <130)
Total CHOL/HDL Ratio: 3.6 (calc) (ref <5.0)
Triglycerides: 211 mg/dL — ABNORMAL HIGH (ref <150)

## 2022-11-07 LAB — TSH: TSH: 0.65 mIU/L (ref 0.40–4.50)

## 2022-11-07 LAB — HEMOGLOBIN A1C
Hgb A1c MFr Bld: 6.8 % of total Hgb — ABNORMAL HIGH (ref <5.7)
Mean Plasma Glucose: 148 mg/dL
eAG (mmol/L): 8.2 mmol/L

## 2022-11-25 LAB — HM DIABETES EYE EXAM

## 2022-12-15 ENCOUNTER — Other Ambulatory Visit: Payer: Self-pay

## 2022-12-15 NOTE — Telephone Encounter (Signed)
Prescription Request  12/15/2022  LOV: 11/06/22  What is the name of the medication or equipment? atorvastatin (LIPITOR) 40 MG tablet [[id number]]  Have you contacted your pharmacy to request a refill? Yes   Which pharmacy would you like this sent to?  [facility] [address], [place] - [address] [place] [address] Phone: [phone] Fax: [phone]    Patient notified that their request is being sent to the clinical staff for review and that they should receive a response within 2 business days.   Please advise at [facility] [phone]

## 2022-12-16 MED ORDER — ATORVASTATIN CALCIUM 40 MG PO TABS: 40.0000 mg | ORAL_TABLET | Freq: Every day | ORAL | 3 refills | Status: DC

## 2022-12-16 NOTE — Telephone Encounter (Signed)
Requested Prescriptions  Pending Prescriptions Disp Refills   atorvastatin (LIPITOR) 40 MG tablet 90 tablet 3    Sig: Take 1 tablet (40 mg total) by mouth daily.     Cardiovascular:  Antilipid - Statins Failed - 12/15/2022 11:10 AM      Failed - Valid encounter within last 12 months    Recent Outpatient Visits           10 months ago Diabetes mellitus without complication (HCC)   [facility] Pickard, Priscille Heidelberg, MD   1 year ago Need for immunization against influenza   [facility] Donita Brooks, MD   1 year ago Diabetes mellitus without complication [facility])   [facility] Donita Brooks, MD   2 years ago BPPV (benign paroxysmal positional vertigo), unspecified laterality   [facility] Donita [facility], MD   3 years ago Diabetes mellitus without complication [facility])   [facility] Donita Brooks, MD              Failed - Lipid Panel in normal range within the last 12 months    Cholesterol  Date Value Ref Range Status  11/06/2022 137 <200 mg/dL Final   LDL Cholesterol (Calc)  Date Value Ref Range Status  11/06/2022 71 mg/dL (calc) Final    Comment:    Reference range: <100 . Desirable range <100 mg/dL for primary prevention;   <70 mg/dL for patients with CHD or diabetic patients  with > or = 2 CHD risk factors. Marland Kitchen LDL-C is now calculated using the Martin-Hopkins  calculation, which is a validated novel method providing  better accuracy than the Friedewald equation in the  estimation of LDL-C.  Horald Pollen et al. Lenox Ahr. 1610;960(45): 2061-2068  (http://education.[facility].com/faq/FAQ164)    Direct LDL  Date Value Ref Range Status  07/09/2017 80 <100 mg/dL Final    Comment:    Greatly elevated Triglycerides values (>1200 mg/dL) interfere with the dLDL assay. As no Triglycerides  testing was ordered, interpret results with caution. . Desirable range <100 mg/dL for primary  prevention;   <70 mg/dL for patients with CHD or diabetic patients  with > or = 2 CHD risk factors. Marland Kitchen    HDL  Date Value Ref Range Status  11/06/2022 38 (L) > OR = 40 mg/dL Final   Triglycerides  Date Value Ref Range Status  11/06/2022 211 (H) <150 mg/dL Final    Comment:    . If a non-fasting specimen was collected, consider repeat triglyceride testing on a fasting specimen if clinically indicated.  Perry Mount et al. J. of Clin. Lipidol. 2015;9:129-169. Marland Kitchen          Passed - Patient is not pregnant

## 2023-01-16 ENCOUNTER — Other Ambulatory Visit: Payer: Self-pay | Admitting: Family Medicine

## 2023-01-16 NOTE — Telephone Encounter (Signed)
Requested Prescriptions  Pending Prescriptions Disp Refills   amLODipine (NORVASC) 5 MG tablet [Pharmacy Med Name: AMLODIPINE BESYLATE 5 MG TAB] 90 tablet 0    Sig: TAKE 1 TABLET (5 MG TOTAL) BY MOUTH DAILY.     Cardiovascular: Calcium Channel Blockers 2 Failed - 01/16/2023  2:29 AM      Failed - Valid encounter within last 6 months    Recent Outpatient Visits           11 months ago Diabetes mellitus without complication (HCC)   [facility] Pickard, Priscille Heidelberg, MD   1 year ago Need for immunization against influenza   [facility] Donita Brooks, MD   1 year ago Diabetes mellitus without complication [facility])   [facility] Donita Brooks, MD   2 years ago BPPV (benign paroxysmal positional vertigo), unspecified laterality   [facility] Donita Brooks, MD   3 years ago Diabetes mellitus without complication [facility])   [facility] Pickard, Priscille Heidelberg, MD              Passed - Last BP in normal range    BP Readings from Last 1 Encounters:  11/06/22 136/82         Passed - Last Heart Rate in normal range    Pulse Readings from Last 1 Encounters:  11/06/22 80

## 2023-02-20 ENCOUNTER — Telehealth: Payer: Self-pay

## 2023-02-20 NOTE — Telephone Encounter (Signed)
LVM re: med refill. Last refill 01/16/23, DA#90, pt should have med left until August.

## 2023-02-20 NOTE — Telephone Encounter (Signed)
Pt called in to inquire about this refill amLODipine (NORVASC) 5 MG tablet. Pt states that he is out of this med.Please advise  LOV: 11/06/22  [facility] [address] [place] - [address] [place], [place] [address] Phone: [phone]  Fax: [phone]  CB#: [phone]

## 2023-02-23 ENCOUNTER — Other Ambulatory Visit: Payer: Self-pay

## 2023-02-23 DIAGNOSIS — I10 Essential (primary) hypertension: Secondary | ICD-10-CM

## 2023-02-23 DIAGNOSIS — E119 Type 2 diabetes mellitus without complications: Secondary | ICD-10-CM

## 2023-02-23 MED ORDER — AMLODIPINE BESYLATE 5 MG PO TABS: 5.0000 mg | ORAL_TABLET | Freq: Every day | ORAL | 1 refills | Status: DC

## 2023-06-25 ENCOUNTER — Other Ambulatory Visit: Payer: Self-pay

## 2023-06-25 NOTE — Telephone Encounter (Signed)
Prescription Request  06/25/2023  LOV: 11/06/22  What is the name of the medication or equipment? levothyroxine (SYNTHROID) 137 MCG tablet [[id number]]  Have you contacted your pharmacy to request a refill? Yes   Which pharmacy would you like this sent to?  [facility] [address] [place] - [address] [place] [address] Phone: [phone] Fax: [phone]    Patient notified that their request is being sent to the clinical staff for review and that they should receive a response within 2 business days.   Please advise at [facility] [phone]

## 2023-06-26 ENCOUNTER — Ambulatory Visit (INDEPENDENT_AMBULATORY_CARE_PROVIDER_SITE_OTHER): Payer: Medicare PPO | Admitting: Family Medicine

## 2023-06-26 VITALS — BP 140/72 | HR 59 | Temp 97.9°F | Ht 73.0 in | Wt 210.0 lb

## 2023-06-26 DIAGNOSIS — M109 Gout, unspecified: Secondary | ICD-10-CM

## 2023-06-26 MED ORDER — COLCHICINE 0.6 MG PO TABS: 0.6000 mg | ORAL_TABLET | Freq: Every day | ORAL | 0 refills | Status: AC

## 2023-06-26 NOTE — Progress Notes (Signed)
Subjective:    Patient ID: [name], male    DOB: [birth year], 77 y.o.   MRN: [id number] Patient recently had a gout attack in his right wrist.  He states that it woke him up from sleep.  His wrist was red swollen and extremely tender.  He took diclofenac and the pain improved.  The pain then returned a day later.  Today he is pain-free.  There is still some mild erythema and warmth however he states that the pain in his wrist has improved dramatically.  Discussed bedside results of basic provide.  He states that his last gout attack was more than a year ago and is usually in his foot. Past Medical History:  Diagnosis Date   Diabetes mellitus without complication (HCC)    Hyperlipidemia    Hypertension    Kidney stones    Thyroid disease    hypothyroidism   Past Surgical History:  Procedure Laterality Date   APPENDECTOMY     HEMORRHOID SURGERY     Current Outpatient Medications on File Prior to Visit  Medication Sig Dispense Refill   amLODipine (NORVASC) 5 MG tablet Take 1 tablet (5 mg total) by mouth daily. 90 tablet 1   aspirin EC 81 MG tablet Take 81 mg by mouth daily.     atorvastatin (LIPITOR) 40 MG tablet Take 1 tablet (40 mg total) by mouth daily. 90 tablet 3   benazepril (LOTENSIN) 40 MG tablet Take 1 tablet (40 mg total) by mouth daily. 90 tablet 3   diclofenac (VOLTAREN) 75 MG EC tablet Take 1 tablet (75 mg total) by mouth 2 (two) times daily as needed for moderate pain. TAKE 1 TABLET BY MOUTH TWICE A DAY 90 tablet 0   levothyroxine (SYNTHROID) 137 MCG tablet TAKE 1 TABLET (137 MCG TOTAL) BY MOUTH DAILY BEFORE BREAKFAST. 90 tablet 3   meclizine (ANTIVERT) 25 MG tablet TAKE 1 TABLET (25 MG TOTAL) BY MOUTH 3 (THREE) TIMES DAILY AS NEEDED FOR DIZZINESS. 30 tablet 0   metFORMIN (GLUCOPHAGE) 1000 MG tablet Take 1 tablet (1,000 mg total) by mouth 2 (two) times daily with a meal. 180 tablet 1   No current facility-administered medications on file prior to visit.   No Known  Allergies Social History   Socioeconomic History   Marital status: Divorced    Spouse name: Not on file   Number of children: 1   Years of education: Not on file   Highest education level: Not on file  Occupational History   Not on file  Tobacco Use   Smoking status: Former   Smokeless tobacco: Former  Substance and Sexual Activity   Alcohol use: No   Drug use: No   Sexual activity: Not on file    Comment: separated from wife,   Other Topics Concern   Not on file  Social History Narrative   2 daughters and 1 son   4 grandchildren   3 great grandchildren   Social Determinants of Health   Financial Resource Strain: Low Risk  (09/09/2022)   Overall Financial Resource Strain (CARDIA)    Difficulty of Paying Living Expenses: Not hard at all  Food Insecurity: No Food Insecurity (09/09/2022)   Hunger Vital Sign    Worried About Running Out of Food in the Last Year: Never true    Ran Out of Food in the Last Year: Never true  Transportation Needs: No Transportation Needs (09/09/2022)   PRAPARE - Transportation    Lack of Transportation (  Medical): No    Lack of Transportation (Non-Medical): No  Physical Activity: Sufficiently Active (09/09/2022)   Exercise Vital Sign    Days of Exercise per Week: 3 days    Minutes of Exercise per Session: 60 min  Stress: No Stress Concern Present (09/09/2022)   [employer] of Occupational Health - Occupational Stress Questionnaire    Feeling of Stress : Not at all  Social Connections: Socially Integrated (09/09/2022)   Social Connection and Isolation Panel [NHANES]    Frequency of Communication with Friends and Family: More than three times a week    Frequency of Social Gatherings with Friends and Family: Three times a week    Attends Religious Services: More than 4 times per year    Active Member of Clubs or Organizations: Yes    Attends Banker Meetings: More than 4 times per year    Marital Status: Married  Catering manager  Violence: Not At Risk (09/09/2022)   Humiliation, Afraid, Rape, and Kick questionnaire    Fear of Current or Ex-Partner: No    Emotionally Abused: No    Physically Abused: No    Sexually Abused: No     Review of Systems  All other systems reviewed and are negative.      Objective:   Physical Exam Vitals reviewed.  Constitutional:      General: He is not in acute distress.    Appearance: Normal appearance. He is well-developed and normal weight. He is not diaphoretic.  HENT:     Head: Normocephalic and atraumatic.  Neck:     Thyroid: No thyromegaly.  Cardiovascular:     Rate and Rhythm: Normal rate and regular rhythm.     Heart sounds: Normal heart sounds. No murmur heard. Pulmonary:     Effort: Pulmonary effort is normal. No respiratory distress.     Breath sounds: Normal breath sounds. No wheezing or rales.  Musculoskeletal:     Right wrist: Swelling present. Decreased range of motion.     Cervical back: Neck supple.  Lymphadenopathy:     Cervical: No cervical adenopathy.  Neurological:     Mental Status: He is alert and oriented to person, place, and time.     Cranial Nerves: No cranial nerve deficit.     Motor: No abnormal muscle tone.     Coordination: Coordination normal.     Deep Tendon Reflexes: Reflexes are normal and symmetric.           Assessment & Plan:  Acute gout of right wrist, unspecified cause - Plan: Uric acid We will check uric acid..  Recommended colchicine to use as an abortive medication.  He can take 2 pills at the first sign of a gout attack.  Repeat 1 pill in 1 hour if the pain persist.  We discussed possibly starting allopurinol as a preventative but at the present time the patient does not want to take a daily medication

## 2023-06-26 NOTE — Telephone Encounter (Signed)
Requested medication (s) are due for refill today: expired date on medication   Requested medication (s) are on the active medication list: yes   Last refill:  04/28/22 #90 3 refills  Future visit scheduled:  yes today   Notes to clinic:  do you want to renew Rx?     Requested Prescriptions  Pending Prescriptions Disp Refills   levothyroxine (SYNTHROID) 137 MCG tablet 90 tablet 3    Sig: TAKE 1 TABLET (137 MCG TOTAL) BY MOUTH DAILY BEFORE BREAKFAST.     Endocrinology:  Hypothyroid Agents Failed - 06/25/2023  8:49 AM      Failed - Valid encounter within last 12 months    Recent Outpatient Visits           1 year ago Diabetes mellitus without complication (HCC)   [facility] Pickard, Priscille Heidelberg, MD   1 year ago Need for immunization against influenza   [facility] Donita Brooks, MD   2 years ago Diabetes mellitus without complication (HCC)   [facility] Donita Brooks, MD   3 years ago BPPV (benign paroxysmal positional vertigo), unspecified laterality   [facility] Donita Brooks, MD   3 years ago Diabetes mellitus without complication (HCC)   [facility] Pickard, Priscille Heidelberg, MD       Future Appointments             Today Pickard, Priscille Heidelberg, MD [facility]            Passed - TSH in normal range and within 360 days    TSH  Date Value Ref Range Status  11/06/2022 0.65 0.40 - 4.50 mIU/L Final

## 2023-06-27 LAB — URIC ACID: Uric Acid, Serum: 6.9 mg/dL (ref 4.0–8.0)

## 2023-07-02 ENCOUNTER — Other Ambulatory Visit: Payer: Self-pay

## 2023-07-02 DIAGNOSIS — E039 Hypothyroidism, unspecified: Secondary | ICD-10-CM

## 2023-07-02 MED ORDER — LEVOTHYROXINE SODIUM 137 MCG PO TABS: ORAL_TABLET | ORAL | 1 refills | Status: DC

## 2023-07-06 ENCOUNTER — Telehealth: Payer: Self-pay

## 2023-07-06 NOTE — Telephone Encounter (Signed)
Pt states that he received his colchicine medication, and the bottle states to take one pill a day. He states that he did not think he was supposed to be taking this and wanted to run it by you

## 2023-07-09 NOTE — Telephone Encounter (Signed)
Pt advised regarding directions for Colchicine and verbalized understanding of all. Mjp,lpn

## 2023-07-16 ENCOUNTER — Other Ambulatory Visit: Payer: Self-pay | Admitting: Family Medicine

## 2023-07-16 MED ORDER — METFORMIN HCL 1000 MG PO TABS: 1000.0000 mg | ORAL_TABLET | Freq: Two times a day (BID) | ORAL | 1 refills | Status: DC

## 2023-07-16 NOTE — Telephone Encounter (Signed)
Requested Prescriptions  Pending Prescriptions Disp Refills   metFORMIN (GLUCOPHAGE) 1000 MG tablet 180 tablet 1    Sig: Take 1 tablet (1,000 mg total) by mouth 2 (two) times daily with a meal.     Endocrinology:  Diabetes - Biguanides Failed - 07/16/2023 11:00 AM      Failed - HBA1C is between 0 and 7.9 and within 180 days    Hgb A1c MFr Bld  Date Value Ref Range Status  11/06/2022 6.8 (H) <5.7 % of total Hgb Final    Comment:    For someone without known diabetes, a hemoglobin A1c value of 6.5% or greater indicates that they may have  diabetes and this should be confirmed with a follow-up  test. . For someone with known diabetes, a value <7% indicates  that their diabetes is well controlled and a value  greater than or equal to 7% indicates suboptimal  control. A1c targets should be individualized based on  duration of diabetes, age, comorbid conditions, and  other considerations. . Currently, no consensus exists regarding use of hemoglobin A1c for diagnosis of diabetes for children. .          Failed - eGFR in normal range and within 360 days    GFR, Est African American  Date Value Ref Range Status  07/13/2018 69 > OR = 60 mL/min/1.51m2 Final   GFR, Est Non African American  Date Value Ref Range Status  07/13/2018 60 > OR = 60 mL/min/1.9m2 Final   eGFR  Date Value Ref Range Status  11/06/2022 59 (L) > OR = 60 mL/min/1.70m2 Final         Failed - B12 Level in normal range and within 720 days    No results found for: "VITAMINB12"       Failed - Valid encounter within last 6 months    Recent Outpatient Visits           1 year ago Diabetes mellitus without complication (HCC)   [facility] Pickard, Priscille Heidelberg, MD   1 year ago Need for immunization against influenza   [facility] Donita Brooks, MD   2 years ago Diabetes mellitus without complication (HCC)   [facility] Donita Brooks, MD   3 years ago BPPV  (benign paroxysmal positional vertigo), unspecified laterality   [facility] Donita Brooks, MD   3 years ago Diabetes mellitus without complication (HCC)   [facility] Pickard, Priscille Heidelberg, MD              Failed - CBC within normal limits and completed in the last 12 months    WBC  Date Value Ref Range Status  01/23/2022 6.2 3.8 - 10.8 Thousand/uL Final   RBC  Date Value Ref Range Status  01/23/2022 4.60 4.20 - 5.80 Million/uL Final   Hemoglobin  Date Value Ref Range Status  01/23/2022 13.9 13.2 - 17.1 g/dL Final   HCT  Date Value Ref Range Status  01/23/2022 41.1 38.5 - 50.0 % Final   MCHC  Date Value Ref Range Status  01/23/2022 33.8 32.0 - 36.0 g/dL Final   [facility]  Date Value Ref Range Status  01/23/2022 30.2 27.0 - 33.0 pg Final   MCV  Date Value Ref Range Status  01/23/2022 89.3 80.0 - 100.0 fL Final   No results found for: "PLTCOUNTKUC", "LABPLAT", "POCPLA" RDW  Date Value Ref Range Status  01/23/2022 12.8 11.0 -  15.0 % Final         Passed - Cr in normal range and within 360 days    Creat  Date Value Ref Range Status  11/06/2022 1.25 0.70 - 1.28 mg/dL Final   Creatinine, Urine  Date Value Ref Range Status  11/06/2022 143 20 - 320 mg/dL Final

## 2023-07-16 NOTE — Telephone Encounter (Signed)
Prescription Request  07/16/2023  LOV: 06/26/2023  What is the name of the medication or equipment? metFORMIN (GLUCOPHAGE) 1000 MG table   Have you contacted your pharmacy to request a refill? Yes   Which pharmacy would you like this sent to?  [facility] [address] [place] - [address] [place] [address] Phone: [phone] Fax: [phone]    Patient notified that their request is being sent to the clinical staff for review and that they should receive a response within 2 business days.   Please advise at [facility] [phone]

## 2023-07-21 ENCOUNTER — Other Ambulatory Visit: Payer: Self-pay | Admitting: Family Medicine

## 2023-07-22 NOTE — Telephone Encounter (Signed)
Requested medication (s) are due for refill today: na    Requested medication (s) are on the active medication list: yes   Last refill:  06/26/23 #30 0 refills   Future visit scheduled: no   Notes to clinic:  last OV 06/26/23 protocol failed last labs 01/23/22.   Pharmacy comment: REQUEST FOR 90 DAYS PRESCRIPTION.    Do you want to refill Rx for 90 days?     Requested Prescriptions  Pending Prescriptions Disp Refills   colchicine 0.6 MG tablet [Pharmacy Med Name: COLCHICINE 0.6 MG TABLET] 90 tablet 1    Sig: TAKE 1 TABLET BY MOUTH EVERY DAY     Endocrinology:  Gout Agents - colchicine Failed - 07/21/2023 10:30 AM      Failed - Valid encounter within last 12 months    Recent Outpatient Visits           1 year ago Diabetes mellitus without complication (HCC)   [facility] Pickard, Priscille Heidelberg, MD   2 years ago Need for immunization against influenza   [facility] Donita Brooks, MD   2 years ago Diabetes mellitus without complication (HCC)   [facility] Donita Brooks, MD   3 years ago BPPV (benign paroxysmal positional vertigo), unspecified laterality   [facility] Donita Brooks, MD   3 years ago Diabetes mellitus without complication (HCC)   [facility] Pickard, Priscille Heidelberg, MD              Failed - CBC within normal limits and completed in the last 12 months    WBC  Date Value Ref Range Status  01/23/2022 6.2 3.8 - 10.8 Thousand/uL Final   RBC  Date Value Ref Range Status  01/23/2022 4.60 4.20 - 5.80 Million/uL Final   Hemoglobin  Date Value Ref Range Status  01/23/2022 13.9 13.2 - 17.1 g/dL Final   HCT  Date Value Ref Range Status  01/23/2022 41.1 38.5 - 50.0 % Final   MCHC  Date Value Ref Range Status  01/23/2022 33.8 32.0 - 36.0 g/dL Final   [facility]  Date Value Ref Range Status  01/23/2022 30.2 27.0 - 33.0 pg Final   MCV  Date Value Ref Range Status  01/23/2022  89.3 80.0 - 100.0 fL Final   No results found for: "PLTCOUNTKUC", "LABPLAT", "POCPLA" RDW  Date Value Ref Range Status  01/23/2022 12.8 11.0 - 15.0 % Final         Passed - Cr in normal range and within 360 days    Creat  Date Value Ref Range Status  11/06/2022 1.25 0.70 - 1.28 mg/dL Final   Creatinine, Urine  Date Value Ref Range Status  11/06/2022 143 20 - 320 mg/dL Final         Passed - ALT in normal range and within 360 days    ALT  Date Value Ref Range Status  11/06/2022 20 9 - 46 U/L Final         Passed - AST in normal range and within 360 days    AST  Date Value Ref Range Status  11/06/2022 20 10 - 35 U/L Final

## 2023-08-23 ENCOUNTER — Other Ambulatory Visit: Payer: Self-pay | Admitting: Family Medicine

## 2023-08-23 DIAGNOSIS — E119 Type 2 diabetes mellitus without complications: Secondary | ICD-10-CM

## 2023-08-23 DIAGNOSIS — I10 Essential (primary) hypertension: Secondary | ICD-10-CM

## 2023-10-12 ENCOUNTER — Telehealth: Payer: Self-pay

## 2023-10-12 ENCOUNTER — Other Ambulatory Visit: Payer: Self-pay

## 2023-10-12 DIAGNOSIS — I10 Essential (primary) hypertension: Secondary | ICD-10-CM

## 2023-10-12 MED ORDER — BENAZEPRIL HCL 40 MG PO TABS: 40.0000 mg | ORAL_TABLET | Freq: Every day | ORAL | 1 refills | Status: DC

## 2023-10-12 NOTE — Telephone Encounter (Signed)
 Prescription Request  10/12/2023  LOV: 06/26/23  What is the name of the medication or equipment? benazepril  (LOTENSIN ) 40 MG tablet [[id number]]   Have you contacted your pharmacy to request a refill? Yes   Which pharmacy would you like this sent to?  [facility] [address] [place] - [address] [place] [address] Phone: [phone] Fax: [phone]    Patient notified that their request is being sent to the clinical staff for review and that they should receive a response within 2 business days.   Please advise at [facility] [phone]

## 2023-10-26 ENCOUNTER — Telehealth: Payer: Self-pay | Admitting: Family Medicine

## 2023-10-26 NOTE — Telephone Encounter (Signed)
 Prescription Request  10/26/2023  LOV: 06/26/2023  What is the name of the medication or equipment?   pioglitazone (ACTOS) 30 MG tablet [[id number]]  DISCONTINUED   Have you contacted your pharmacy to request a refill? Yes   Which pharmacy would you like this sent to?  [facility] [address] [place] - [address] [place] [address] Phone: [phone] Fax: [phone]    Patient notified that their request is being sent to the clinical staff for review and that they should receive a response within 2 business days.   Please advise pharmacist.

## 2023-10-26 NOTE — Telephone Encounter (Signed)
 pioglitazone (ACTOS) 30 MG tablet [[id number]]  DISCONTINUED   Medication as been d/c since 10/24, tried calling pt, no answer. Will try again.

## 2023-11-17 ENCOUNTER — Telehealth: Payer: Self-pay | Admitting: Family Medicine

## 2023-11-17 NOTE — Telephone Encounter (Signed)
 Copied from CRM [id number]. Topic: Clinical - Medication Refill >> Nov 17, 2023 10:31 AM Nyra Capes wrote: Most Recent Primary Care Visit:  Provider: Lynnea Ferrier T  Department[facility]  Visit Type: OFFICE VISIT  Date: 06/26/2023  Medication: pioglitazone (ACTOS) 30 MG tablet [[id number]]  DISCONTINUED   Has the patient contacted their pharmacy? Yes pharmacy informed patient provider has not sent in a prescription for this medication   Is this the correct pharmacy for this prescription? Yes If no, delete pharmacy and type the correct one.  This is the patient's preferred pharmacy:  [facility] [address] [place] - [address] [place] [address] Phone: [phone] Fax: [phone]   Has the prescription been filled recently? No  Is the patient out of the medication? Yes  Has the patient been seen for an appointment in the last year OR does the patient have an upcoming appointment? Yes  Can we respond through MyChart? No prefer to talk to nurse about this  Agent: Please be advised that Rx refills may take up to 3 business days. We ask that you follow-up with your pharmacy.  Patient phone # [phone]  patient would like to know why this hasn't been refilled, has been out of the medication for 10 days.

## 2023-11-17 NOTE — Telephone Encounter (Unsigned)
 Copied from CRM [id number]. Topic: General - Other >> Nov 17, 2023  3:47 PM Fredrich Romans wrote: Reason for CRM: Patient returned call to nurse Germaine Pomfret.Patient would like a return call when she gets a chance

## 2023-11-19 ENCOUNTER — Telehealth: Payer: Self-pay

## 2023-11-19 ENCOUNTER — Other Ambulatory Visit: Payer: Self-pay | Admitting: Family Medicine

## 2023-11-19 ENCOUNTER — Other Ambulatory Visit: Payer: Self-pay

## 2023-11-19 MED ORDER — PIOGLITAZONE HCL 30 MG PO TABS: 30.0000 mg | ORAL_TABLET | Freq: Every day | ORAL | 1 refills | Status: DC

## 2023-11-19 NOTE — Telephone Encounter (Signed)
 Spoke with patient, pt states he has been out of the medication x 1 week. It looks like the medication was d/c'd in December. Is he supposed to be taking? Thanks.   Copied from CRM [id number]. Topic: Clinical - Medication Refill >> Nov 17, 2023 10:31 AM Nyra Capes wrote: Most Recent Primary Care Visit:  Provider: Lynnea Ferrier T  Department: [facility]  Visit Type: OFFICE VISIT  Date: 06/26/2023   Medication: pioglitazone (ACTOS) 30 MG tablet [[id number]]  DISCONTINUED    Has the patient contacted their pharmacy? Yes pharmacy informed patient provider has not sent in a prescription for this medication    Is this the correct pharmacy for this prescription? Yes If no, delete pharmacy and type the correct one.  This is the patient's preferred pharmacy:  [facility] [address] [place] - [address] [place] [address] Phone: [phone] Fax: [phone]

## 2023-11-24 ENCOUNTER — Other Ambulatory Visit: Payer: Self-pay | Admitting: Family Medicine

## 2023-11-26 NOTE — Telephone Encounter (Signed)
 Requested medication (s) are due for refill today: yes  Requested medication (s) are on the active medication list: yes  Last refill:  09/18/22 #90  Future visit scheduled: no  Notes to clinic:  overdue labs   Requested Prescriptions  Pending Prescriptions Disp Refills   diclofenac (VOLTAREN) 75 MG EC tablet [Pharmacy Med Name: DICLOFENAC SOD EC 75 MG TAB] 90 tablet 0    Sig: TAKE 1 TABLET BY MOUTH 2 TIMES DAILY AS NEEDED FOR MODERATE PAIN. TAKE 1 TABLET BY MOUTH TWICE A DAY     Analgesics:  NSAIDS Failed - 11/26/2023 10:36 AM      Failed - Manual Review: Labs are only required if the patient has taken medication for more than 8 weeks.      Failed - Cr in normal range and within 360 days    Creat  Date Value Ref Range Status  11/06/2022 1.25 0.70 - 1.28 mg/dL Final   Creatinine, Urine  Date Value Ref Range Status  11/06/2022 143 20 - 320 mg/dL Final         Failed - HGB in normal range and within 360 days    Hemoglobin  Date Value Ref Range Status  01/23/2022 13.9 13.2 - 17.1 g/dL Final         Failed - PLT in normal range and within 360 days    Platelets  Date Value Ref Range Status  01/23/2022 157 140 - 400 Thousand/uL Final         Failed - HCT in normal range and within 360 days    HCT  Date Value Ref Range Status  01/23/2022 41.1 38.5 - 50.0 % Final         Failed - eGFR is 30 or above and within 360 days    GFR, Est African American  Date Value Ref Range Status  07/13/2018 69 > OR = 60 mL/min/1.35m2 Final   GFR, Est Non African American  Date Value Ref Range Status  07/13/2018 60 > OR = 60 mL/min/1.77m2 Final   eGFR  Date Value Ref Range Status  11/06/2022 59 (L) > OR = 60 mL/min/1.38m2 Final         Passed - Patient is not pregnant      Passed - Valid encounter within last 12 months    Recent Outpatient Visits           5 months ago Acute gout of right wrist, unspecified cause   [facility] Donita Brooks, MD    1 year ago Hypothyroidism, unspecified type   [place] [facility] Donita Brooks, MD   1 year ago Primary osteoarthritis of both knees   [facility] Pickard, Priscille Heidelberg, MD   1 year ago Leg swelling   [place] [facility] Pickard, Priscille Heidelberg, MD

## 2024-01-07 ENCOUNTER — Encounter: Payer: Self-pay | Admitting: Family Medicine

## 2024-01-07 ENCOUNTER — Ambulatory Visit (INDEPENDENT_AMBULATORY_CARE_PROVIDER_SITE_OTHER): Admitting: Family Medicine

## 2024-01-07 VITALS — BP 144/76 | HR 65 | Temp 97.8°F | Ht 73.0 in | Wt 210.4 lb

## 2024-01-07 DIAGNOSIS — E1169 Type 2 diabetes mellitus with other specified complication: Secondary | ICD-10-CM | POA: Diagnosis not present

## 2024-01-07 DIAGNOSIS — E119 Type 2 diabetes mellitus without complications: Secondary | ICD-10-CM

## 2024-01-07 DIAGNOSIS — Z7984 Long term (current) use of oral hypoglycemic drugs: Secondary | ICD-10-CM | POA: Diagnosis not present

## 2024-01-07 DIAGNOSIS — M1711 Unilateral primary osteoarthritis, right knee: Secondary | ICD-10-CM | POA: Diagnosis not present

## 2024-01-07 DIAGNOSIS — E039 Hypothyroidism, unspecified: Secondary | ICD-10-CM | POA: Diagnosis not present

## 2024-01-07 DIAGNOSIS — M170 Bilateral primary osteoarthritis of knee: Secondary | ICD-10-CM

## 2024-01-07 MED ORDER — TRIAMCINOLONE ACETONIDE 40 MG/ML IJ SUSP
80.0000 mg | Freq: Once | INTRAMUSCULAR | Status: AC
  Administered 2024-01-07: 80 mg via INTRA_ARTICULAR

## 2024-01-07 NOTE — Progress Notes (Signed)
 Subjective:    Patient ID: [name], male    DOB: 1946-07-02, 78 y.o.   MRN: [id number] Patient has been seen in the past for bilateral knee pain due to osteoarthritis.  He has been taking diclofenac  2 times to 3 times a week for knee pain however it helps very little.  He reports pain right greater than left with prolonged standing.  It hurts for him to play golf.  Is compromising his quality of life.  He is interested in taking the next step to treat his osteoarthritis.  He denies any instability in the knee.  He denies any fevers or chills.  He is long overdue for fasting lab work.  Patient was unaware that it has been almost a year since we checked his diabetes.  He is willing to draw blood work while he is here today. Past Medical History:  Diagnosis Date   Diabetes mellitus without complication (HCC)    Hyperlipidemia    Hypertension    Kidney stones    Thyroid  disease    hypothyroidism   Past Surgical History:  Procedure Laterality Date   APPENDECTOMY     HEMORRHOID SURGERY     Current Outpatient Medications on File Prior to Visit  Medication Sig Dispense Refill   amLODipine  (NORVASC ) 5 MG tablet TAKE 1 TABLET (5 MG TOTAL) BY MOUTH DAILY. 90 tablet 1   aspirin EC 81 MG tablet Take 81 mg by mouth daily.     atorvastatin  (LIPITOR) 40 MG tablet Take 1 tablet (40 mg total) by mouth daily. 90 tablet 3   benazepril  (LOTENSIN ) 40 MG tablet Take 1 tablet (40 mg total) by mouth daily. 90 tablet 1   colchicine  0.6 MG tablet Take 1 tablet (0.6 mg total) by mouth daily. 30 tablet 0   diclofenac  (VOLTAREN ) 75 MG EC tablet TAKE 1 TABLET BY MOUTH 2 TIMES DAILY AS NEEDED FOR MODERATE PAIN. TAKE 1 TABLET BY MOUTH TWICE A DAY 90 tablet 0   levothyroxine  (SYNTHROID ) 137 MCG tablet TAKE 1 TABLET (137 MCG TOTAL) BY MOUTH DAILY BEFORE BREAKFAST. 90 tablet 1   meclizine  (ANTIVERT ) 25 MG tablet TAKE 1 TABLET (25 MG TOTAL) BY MOUTH 3 (THREE) TIMES DAILY AS NEEDED FOR DIZZINESS. 30 tablet 0    metFORMIN  (GLUCOPHAGE ) 1000 MG tablet Take 1 tablet (1,000 mg total) by mouth 2 (two) times daily with a meal. 180 tablet 1   pioglitazone  (ACTOS ) 30 MG tablet Take 1 tablet (30 mg total) by mouth daily. 90 tablet 1   No current facility-administered medications on file prior to visit.   No Known Allergies Social History   Socioeconomic History   Marital status: Divorced    Spouse name: Not on file   Number of children: 1   Years of education: Not on file   Highest education level: Not on file  Occupational History   Not on file  Tobacco Use   Smoking status: Former   Smokeless tobacco: Former  Substance and Sexual Activity   Alcohol use: No   Drug use: No   Sexual activity: Not on file    Comment: separated from wife,   Other Topics Concern   Not on file  Social History Narrative   2 daughters and 1 son   4 grandchildren   3 great grandchildren   Social Drivers of Corporate investment banker Strain: Low Risk  (09/09/2022)   Overall Financial Resource Strain (CARDIA)    Difficulty of Paying Living Expenses:  Not hard at all  Food Insecurity: No Food Insecurity (09/09/2022)   Hunger Vital Sign    Worried About Running Out of Food in the Last Year: Never true    Ran Out of Food in the Last Year: Never true  Transportation Needs: No Transportation Needs (09/09/2022)   PRAPARE - Administrator, Civil Service (Medical): No    Lack of Transportation (Non-Medical): No  Physical Activity: Sufficiently Active (09/09/2022)   Exercise Vital Sign    Days of Exercise per Week: 3 days    Minutes of Exercise per Session: 60 min  Stress: No Stress Concern Present (09/09/2022)   [employer] of Occupational Health - Occupational Stress Questionnaire    Feeling of Stress : Not at all  Social Connections: Socially Integrated (09/09/2022)   Social Connection and Isolation Panel [NHANES]    Frequency of Communication with Friends and Family: More than three times a week     Frequency of Social Gatherings with Friends and Family: Three times a week    Attends Religious Services: More than 4 times per year    Active Member of Clubs or Organizations: Yes    Attends Banker Meetings: More than 4 times per year    Marital Status: Married  Catering manager Violence: Not At Risk (09/09/2022)   Humiliation, Afraid, Rape, and Kick questionnaire    Fear of Current or Ex-Partner: No    Emotionally Abused: No    Physically Abused: No    Sexually Abused: No     Review of Systems  All other systems reviewed and are negative.      Objective:   Physical Exam Vitals reviewed.  Constitutional:      General: He is not in acute distress.    Appearance: Normal appearance. He is well-developed and normal weight. He is not diaphoretic.  HENT:     Head: Normocephalic and atraumatic.  Neck:     Thyroid : No thyromegaly.  Cardiovascular:     Rate and Rhythm: Normal rate and regular rhythm.     Heart sounds: Normal heart sounds. No murmur heard. Pulmonary:     Effort: Pulmonary effort is normal. No respiratory distress.     Breath sounds: Normal breath sounds. No wheezing or rales.  Musculoskeletal:     Cervical back: Neck supple.     Right knee: Crepitus present. No bony tenderness. Normal range of motion. No tenderness.     Left knee: Crepitus present. No bony tenderness. Normal range of motion. No tenderness.  Lymphadenopathy:     Cervical: No cervical adenopathy.  Neurological:     Mental Status: He is alert and oriented to person, place, and time.     Cranial Nerves: No cranial nerve deficit.     Motor: No abnormal muscle tone.     Coordination: Coordination normal.     Deep Tendon Reflexes: Reflexes are normal and symmetric.           Assessment & Plan:  Hypothyroidism, unspecified type - Plan: TSH  Diabetes mellitus without complication (HCC) - Plan: Hemoglobin A1c, CBC with Differential/Platelet, Comprehensive metabolic panel with GFR,  TSH  Primary osteoarthritis of both knees Patient has bilateral knee pain due to osteoarthritis that is unrelieved by diclofenac .  He would like to try cortisone injection in his right knee.  Using sterile technique, I injected the right knee with 2 cc of lidocaine, 2 cc of Marcaine, 2 cc of 40 mg/mL Kenalog.  Patient tolerated procedure without  complication.  While the patient is here today I will check a CBC, CBC obviously, and HgA1c.

## 2024-01-07 NOTE — Addendum Note (Signed)
 Addended by: Gillermo Lack K on: 01/07/2024 04:23 PM   Modules accepted: Orders

## 2024-01-08 LAB — CBC WITH DIFFERENTIAL/PLATELET
Absolute Lymphocytes: 1652 {cells}/uL (ref 850–3900)
Absolute Monocytes: 502 {cells}/uL (ref 200–950)
Basophils Absolute: 53 {cells}/uL (ref 0–200)
Basophils Relative: 0.9 %
Eosinophils Absolute: 289 {cells}/uL (ref 15–500)
Eosinophils Relative: 4.9 %
HCT: 38.8 % (ref 38.5–50.0)
Hemoglobin: 13 g/dL — ABNORMAL LOW (ref 13.2–17.1)
MCH: 29.8 pg (ref 27.0–33.0)
MCHC: 33.5 g/dL (ref 32.0–36.0)
MCV: 89 fL (ref 80.0–100.0)
MPV: 11.9 fL (ref 7.5–12.5)
Monocytes Relative: 8.5 %
Neutro Abs: 3404 {cells}/uL (ref 1500–7800)
Neutrophils Relative %: 57.7 %
Platelets: 170 10*3/uL (ref 140–400)
RBC: 4.36 10*6/uL (ref 4.20–5.80)
RDW: 12.6 % (ref 11.0–15.0)
Total Lymphocyte: 28 %
WBC: 5.9 10*3/uL (ref 3.8–10.8)

## 2024-01-08 LAB — COMPREHENSIVE METABOLIC PANEL WITH GFR
AG Ratio: 1.6 (calc) (ref 1.0–2.5)
ALT: 14 U/L (ref 9–46)
AST: 15 U/L (ref 10–35)
Albumin: 4.6 g/dL (ref 3.6–5.1)
Alkaline phosphatase (APISO): 111 U/L (ref 35–144)
BUN: 20 mg/dL (ref 7–25)
CO2: 26 mmol/L (ref 20–32)
Calcium: 9.6 mg/dL (ref 8.6–10.3)
Chloride: 104 mmol/L (ref 98–110)
Creat: 1.26 mg/dL (ref 0.70–1.28)
Globulin: 2.8 g/dL (ref 1.9–3.7)
Glucose, Bld: 83 mg/dL (ref 65–99)
Potassium: 4.4 mmol/L (ref 3.5–5.3)
Sodium: 140 mmol/L (ref 135–146)
Total Bilirubin: 0.8 mg/dL (ref 0.2–1.2)
Total Protein: 7.4 g/dL (ref 6.1–8.1)
eGFR: 58 mL/min/{1.73_m2} — ABNORMAL LOW (ref >=60)

## 2024-01-08 LAB — HEMOGLOBIN A1C
Hgb A1c MFr Bld: 6.8 % — ABNORMAL HIGH (ref <5.7)
Mean Plasma Glucose: 148 mg/dL
eAG (mmol/L): 8.2 mmol/L

## 2024-01-08 LAB — TSH: TSH: 1.19 m[IU]/L (ref 0.40–4.50)

## 2024-01-12 ENCOUNTER — Other Ambulatory Visit: Payer: Self-pay

## 2024-01-12 NOTE — Telephone Encounter (Signed)
 Prescription Request  01/12/2024  LOV: 01/07/24  What is the name of the medication or equipment? diclofenac  (VOLTAREN ) 75 MG EC tablet [[id number]]   Have you contacted your pharmacy to request a refill? Yes   Which pharmacy would you like this sent to?  [facility] [address] [place] - [address] [place] [address] Phone: [phone] Fax: [phone]    Patient notified that their request is being sent to the clinical staff for review and that they should receive a response within 2 business days.   Please advise at [facility] [phone]

## 2024-01-14 MED ORDER — DICLOFENAC SODIUM 75 MG PO TBEC: 75.0000 mg | DELAYED_RELEASE_TABLET | Freq: Two times a day (BID) | ORAL | 0 refills | Status: AC

## 2024-01-14 NOTE — Telephone Encounter (Signed)
 Requested Prescriptions  Pending Prescriptions Disp Refills   diclofenac  (VOLTAREN ) 75 MG EC tablet 90 tablet 0    Sig: Take 1 tablet (75 mg total) by mouth 2 (two) times daily.     Analgesics:  NSAIDS Failed - 01/14/2024  7:58 AM      Failed - Manual Review: Labs are only required if the patient has taken medication for more than 8 weeks.      Failed - HGB in normal range and within 360 days    Hemoglobin  Date Value Ref Range Status  01/07/2024 13.0 (L) 13.2 - 17.1 g/dL Final         Passed - Cr in normal range and within 360 days    Creat  Date Value Ref Range Status  01/07/2024 1.26 0.70 - 1.28 mg/dL Final   Creatinine, Urine  Date Value Ref Range Status  11/06/2022 143 20 - 320 mg/dL Final         Passed - PLT in normal range and within 360 days    Platelets  Date Value Ref Range Status  01/07/2024 170 140 - 400 Thousand/uL Final         Passed - HCT in normal range and within 360 days    HCT  Date Value Ref Range Status  01/07/2024 38.8 38.5 - 50.0 % Final         Passed - eGFR is 30 or above and within 360 days    GFR, Est African American  Date Value Ref Range Status  07/13/2018 69 > OR = 60 mL/min/1.28m2 Final   GFR, Est Non African American  Date Value Ref Range Status  07/13/2018 60 > OR = 60 mL/min/1.72m2 Final   eGFR  Date Value Ref Range Status  01/07/2024 58 (L) > OR = 60 mL/min/1.40m2 Final         Passed - Patient is not pregnant      Passed - Valid encounter within last 12 months    Recent Outpatient Visits           1 week ago Hypothyroidism, unspecified type   [place] [facility] Medicine Austine Lefort, MD   6 months ago Acute gout of right wrist, unspecified cause   [place] [facility] Austine Lefort, MD   1 year ago Hypothyroidism, unspecified type   [place] [facility] Austine Lefort, MD   1 year ago Primary osteoarthritis of both knees   [facility] Pickard, Cisco Crest, MD   1 year ago Leg swelling   [place] [facility] Medicine Pickard, Cisco Crest, MD

## 2024-01-18 ENCOUNTER — Other Ambulatory Visit: Payer: Self-pay | Admitting: Family Medicine

## 2024-01-18 DIAGNOSIS — E039 Hypothyroidism, unspecified: Secondary | ICD-10-CM

## 2024-01-18 NOTE — Telephone Encounter (Signed)
 Prescription Request  01/18/2024  LOV: 01/07/2024  What is the name of the medication or equipment? levothyroxine  (SYNTHROID ) 137 MCG tablet   Have you contacted your pharmacy to request a refill? Yes   Which pharmacy would you like this sent to?  [facility] [address] [place] - [address] [place] [address] Phone: [phone] Fax: [phone]    Patient notified that their request is being sent to the clinical staff for review and that they should receive a response within 2 business days.   Please advise at [facility] [phone]

## 2024-01-20 MED ORDER — LEVOTHYROXINE SODIUM 137 MCG PO TABS: ORAL_TABLET | ORAL | 1 refills | Status: DC

## 2024-01-20 NOTE — Telephone Encounter (Signed)
 LOV 01/07/2024  Requested Prescriptions  Pending Prescriptions Disp Refills   levothyroxine  (SYNTHROID ) 137 MCG tablet 90 tablet 1    Sig: TAKE 1 TABLET (137 MCG TOTAL) BY MOUTH DAILY BEFORE BREAKFAST.     Endocrinology:  Hypothyroid Agents Passed - 01/20/2024 11:19 AM      Passed - TSH in normal range and within 360 days    TSH  Date Value Ref Range Status  01/07/2024 1.19 0.40 - 4.50 mIU/L Final         Passed - Valid encounter within last 12 months    Recent Outpatient Visits           1 week ago Hypothyroidism, unspecified type   [facility] Austine Lefort, MD   6 months ago Acute gout of right wrist, unspecified cause   [facility] Austine Lefort, MD   1 year ago Hypothyroidism, unspecified type   [place] [facility] Medicine Austine Lefort, MD   1 year ago Primary osteoarthritis of both knees   [place] [facility] Medicine Pickard, Cisco Crest, MD   1 year ago Leg swelling   [place] [facility] Medicine Pickard, Cisco Crest, MD

## 2024-02-01 ENCOUNTER — Other Ambulatory Visit: Payer: Self-pay

## 2024-02-01 ENCOUNTER — Telehealth: Payer: Self-pay

## 2024-02-01 MED ORDER — ATORVASTATIN CALCIUM 40 MG PO TABS: 40.0000 mg | ORAL_TABLET | Freq: Every day | ORAL | 3 refills | Status: AC

## 2024-02-01 NOTE — Telephone Encounter (Signed)
 Prescription Request  02/01/2024  LOV: 01/07/24  What is the name of the medication or equipment? atorvastatin  (LIPITOR) 40 MG tablet [[id number]]   Have you contacted your pharmacy to request a refill? Yes   Which pharmacy would you like this sent to?  [facility] [address] [place] - [address] [place] [address] Phone: [phone] Fax: [phone]    Patient notified that their request is being sent to the clinical staff for review and that they should receive a response within 2 business days.   Please advise at [facility] [phone]

## 2024-02-02 DIAGNOSIS — E119 Type 2 diabetes mellitus without complications: Secondary | ICD-10-CM | POA: Diagnosis not present

## 2024-02-02 DIAGNOSIS — H353131 Nonexudative age-related macular degeneration, bilateral, early dry stage: Secondary | ICD-10-CM | POA: Diagnosis not present

## 2024-02-02 DIAGNOSIS — H2513 Age-related nuclear cataract, bilateral: Secondary | ICD-10-CM | POA: Diagnosis not present

## 2024-02-02 DIAGNOSIS — Z0100 Encounter for examination of eyes and vision without abnormal findings: Secondary | ICD-10-CM | POA: Diagnosis not present

## 2024-02-02 LAB — HM DIABETES EYE EXAM

## 2024-02-04 ENCOUNTER — Ambulatory Visit: Payer: Self-pay

## 2024-02-04 NOTE — Telephone Encounter (Signed)
 Copied from CRM [id number]. Topic: Clinical - Lab/Test Results >> Feb 04, 2024  3:54 PM Alysia Jumbo S wrote: Reason for CRM: Patient has questions about lab results from 01/07/24 Answer Assessment - Initial Assessment Questions 1. REASON FOR CALL or QUESTION: "What is your reason for calling today?" or "How can I best help you?" or "What question do you have that I can help answer?"     ---- Pt inquiring on what his Hemoglobin A1c was from 01/07/24    This nurse informed him it was 6.8  Patient verbalized understanding. No additional questions/concerns noted during the time of the call.  Protocols used: PCP Call - No Triage-A-AH

## 2024-03-18 ENCOUNTER — Telehealth: Payer: Self-pay | Admitting: Family Medicine

## 2024-03-18 NOTE — Telephone Encounter (Signed)
 Prescription Request  03/18/2024  LOV: 01/07/2024  What is the name of the medication or equipment?   amLODipine  (NORVASC ) 5 MG tablet  **90 day script request**  Have you contacted your pharmacy to request a refill? Yes   Which pharmacy would you like this sent to?  [facility] [address] [place] - [name] Phone: [phone] Fax: [phone]    Patient notified that their request is being sent to the clinical staff for review and that they should receive a response within 2 business days.   Please advise pharmacist.

## 2024-03-21 ENCOUNTER — Other Ambulatory Visit: Payer: Self-pay

## 2024-03-21 DIAGNOSIS — E119 Type 2 diabetes mellitus without complications: Secondary | ICD-10-CM

## 2024-03-21 DIAGNOSIS — I10 Essential (primary) hypertension: Secondary | ICD-10-CM

## 2024-03-21 MED ORDER — AMLODIPINE BESYLATE 5 MG PO TABS: 5.0000 mg | ORAL_TABLET | Freq: Every day | ORAL | 1 refills | Status: AC

## 2024-03-21 MED ORDER — BENAZEPRIL HCL 40 MG PO TABS: 40.0000 mg | ORAL_TABLET | Freq: Every day | ORAL | 1 refills | Status: AC

## 2024-03-21 NOTE — Telephone Encounter (Signed)
 Both medications have been refilled for the pt.

## 2024-04-07 ENCOUNTER — Ambulatory Visit

## 2024-05-24 ENCOUNTER — Other Ambulatory Visit: Payer: Self-pay | Admitting: Family Medicine

## 2024-06-02 ENCOUNTER — Other Ambulatory Visit: Payer: Self-pay | Admitting: Family Medicine

## 2024-08-11 ENCOUNTER — Other Ambulatory Visit: Payer: Self-pay

## 2024-08-11 ENCOUNTER — Telehealth: Payer: Self-pay

## 2024-08-11 DIAGNOSIS — E039 Hypothyroidism, unspecified: Secondary | ICD-10-CM

## 2024-08-11 MED ORDER — LEVOTHYROXINE SODIUM 137 MCG PO TABS: ORAL_TABLET | ORAL | 1 refills | Status: AC

## 2024-08-11 NOTE — Telephone Encounter (Signed)
 Prescription Request  08/11/2024  LOV: 01/07/24  What is the name of the medication or equipment? levothyroxine  (SYNTHROID ) 137 MCG tablet [[id number]]   Have you contacted your pharmacy to request a refill? Yes   Which pharmacy would you like this sent to?  [facility] [address] [place] - [address] [place] [address] Phone: [phone] Fax: [phone]    Patient notified that their request is being sent to the clinical staff for review and that they should receive a response within 2 business days.   Please advise at [facility] [phone]

## 2024-08-11 NOTE — Telephone Encounter (Signed)
 Sent in medication
